# Patient Record
Sex: Female | Born: 1985 | Race: White | Hispanic: No | Marital: Married | Smoking: Never smoker
Health system: Southern US, Community
[De-identification: ages and names within clinical notes are randomized; demographics above are authoritative.]

## PROBLEM LIST (undated history)

## (undated) DIAGNOSIS — T4145XA Adverse effect of unspecified anesthetic, initial encounter: Secondary | ICD-10-CM

## (undated) DIAGNOSIS — F419 Anxiety disorder, unspecified: Secondary | ICD-10-CM

## (undated) DIAGNOSIS — D219 Benign neoplasm of connective and other soft tissue, unspecified: Secondary | ICD-10-CM

## (undated) HISTORY — DX: Anxiety disorder, unspecified: F41.9

## (undated) HISTORY — PX: OTHER SURGICAL HISTORY: SHX169

## (undated) HISTORY — DX: Benign neoplasm of connective and other soft tissue, unspecified: D21.9

---

## 2008-09-19 DIAGNOSIS — D219 Benign neoplasm of connective and other soft tissue, unspecified: Secondary | ICD-10-CM

## 2008-09-19 DIAGNOSIS — T8859XA Other complications of anesthesia, initial encounter: Secondary | ICD-10-CM

## 2008-09-19 HISTORY — DX: Benign neoplasm of connective and other soft tissue, unspecified: D21.9

## 2008-09-19 HISTORY — DX: Other complications of anesthesia, initial encounter: T88.59XA

## 2008-09-19 HISTORY — PX: MYOMECTOMY ABDOMINAL APPROACH: SUR870

## 2011-08-09 ENCOUNTER — Encounter: Payer: Self-pay | Admitting: *Deleted

## 2011-08-09 ENCOUNTER — Ambulatory Visit (INDEPENDENT_AMBULATORY_CARE_PROVIDER_SITE_OTHER): Payer: 59 | Admitting: Gynecology

## 2011-08-09 ENCOUNTER — Other Ambulatory Visit (HOSPITAL_COMMUNITY)
Admission: RE | Admit: 2011-08-09 | Discharge: 2011-08-09 | Disposition: A | Discharge status: Home / Self Care | Payer: Commercial Managed Care - PPO | Source: Ambulatory Visit | Attending: Gynecology | Admitting: Gynecology

## 2011-08-09 VITALS — BP 112/70 | Ht 62.0 in | Wt 173.0 lb

## 2011-08-09 DIAGNOSIS — B373 Candidiasis of vulva and vagina: Secondary | ICD-10-CM

## 2011-08-09 DIAGNOSIS — N898 Other specified noninflammatory disorders of vagina: Secondary | ICD-10-CM

## 2011-08-09 DIAGNOSIS — D259 Leiomyoma of uterus, unspecified: Secondary | ICD-10-CM

## 2011-08-09 DIAGNOSIS — F411 Generalized anxiety disorder: Secondary | ICD-10-CM

## 2011-08-09 DIAGNOSIS — Z01419 Encounter for gynecological examination (general) (routine) without abnormal findings: Secondary | ICD-10-CM | POA: Insufficient documentation

## 2011-08-09 DIAGNOSIS — Z13220 Encounter for screening for lipoid disorders: Secondary | ICD-10-CM

## 2011-08-09 DIAGNOSIS — R82998 Other abnormal findings in urine: Secondary | ICD-10-CM

## 2011-08-09 DIAGNOSIS — Z113 Encounter for screening for infections with a predominantly sexual mode of transmission: Secondary | ICD-10-CM

## 2011-08-09 DIAGNOSIS — Z3041 Encounter for surveillance of contraceptive pills: Secondary | ICD-10-CM

## 2011-08-09 DIAGNOSIS — F419 Anxiety disorder, unspecified: Secondary | ICD-10-CM

## 2011-08-09 DIAGNOSIS — Z131 Encounter for screening for diabetes mellitus: Secondary | ICD-10-CM

## 2011-08-09 MED ORDER — FLUCONAZOLE 150 MG PO TABS: 150.0000 mg | ORAL_TABLET | Freq: Once | ORAL | Status: AC

## 2011-08-09 MED ORDER — ESTRADIOL VALERATE-DIENOGEST 3/2-2/2-3/1 MG PO TABS: 1.0000 | ORAL_TABLET | Freq: Every day | ORAL | Status: DC

## 2011-08-09 MED ORDER — ESCITALOPRAM OXALATE 20 MG PO TABS: 20.0000 mg | ORAL_TABLET | Freq: Every day | ORAL | Status: DC

## 2011-08-09 NOTE — Progress Notes (Signed)
Sally Edwards [birth year] [id number]        25 y.o.  New patient for annual exam.  Several issues as noted below.  Past medical history,surgical history, medications, allergies, family history and social history were all reviewed and documented in the EPIC chart. ROS:  Was performed and pertinent positives and negatives are included in the history.  Exam: chaperone present Filed Vitals:   08/09/11 1007  BP: 112/70   General appearance  Normal Skin grossly normal Head/Neck normal with no cervical or supraclavicular adenopathy thyroid normal Lungs  clear Cardiac RR, without RMG Abdominal  soft, nontender, without masses, organomegaly or hernia, well healed Pfannenstiel incision Breasts  examined lying and sitting without masses, retractions, discharge or axillary adenopathy. Pelvic  Ext/BUS/vagina  normal with white discharge  Cervix  normal  GC Chlamydia screen  Uterus  axial, normal size, shape and contour, midline and mobile nontender   Adnexa  Without masses or tenderness    Anus and perineum  normal   Rectovaginal  normal sphincter tone without palpated masses or tenderness.    Assessment/Plan:  25 y.o. female for annual exam.    1. White discharge. KOH wet prep is positive for yeast. Will treat with Diflucan 150x1 dose follow up if symptoms persist or recur. 2. Birth control. Patient's on the [place] doing well wants to continue. Issues of stroke heart attack DVT reviewed I refilled her times a year. 3. Leiomyoma. Patient has history of single large myoma with open myomectomy in [place] 2010.  Surgeon had recommended C-section with deliveries. Physical exam is normal today. We talked about ultrasound screening. I do not think that that would change anything given her normal physical and will hold on that at present and she will continue with annual exams. As long as her exams are normal we will follow. If there is a question of growth then we'll ultrasound. 4. STD  screening. Patient asked for GC Chlamydia screen. She has no known exposure but wants to be screened. Offered serum screening for HIV, RPR, hepatitis B and C and she declined. 5. Anxiety. Patient is on Lexapro 20 mg daily for several years doing well I refilled her times a year. 6. Breast health. SBE monthly reviewed. 7. Pap smear. Patient does have history of abnormal Pap several years ago with follow up colposcopy and biopsy, no therapy otherwise was rendered she's had normal Pap smears since then historically. I have no records of this. I did a Pap smear today. I reviewed possible less frequent screening protocol once I review her other records and Pap smears and she is to get those records for me. 8. Health maintenance. Baseline CBC glucose lipid profile urinalysis was done. Assuming she continues well from a gynecologic standpoint she will see me in a year sooner as needed.   Dara Lords MD, 10:48 AM 08/09/2011

## 2011-08-09 NOTE — Progress Notes (Signed)
Addended by: Landis Martins R on: 08/09/2011 11:37 AM   Modules accepted: Orders

## 2011-08-10 ENCOUNTER — Other Ambulatory Visit: Payer: Self-pay | Admitting: *Deleted

## 2011-08-10 NOTE — Progress Notes (Signed)
Addended byCammie Mcgee T on: 08/10/2011 03:14 PM   Modules accepted: Orders

## 2011-08-15 ENCOUNTER — Other Ambulatory Visit: Payer: 59

## 2012-01-12 ENCOUNTER — Telehealth: Payer: Self-pay | Admitting: *Deleted

## 2012-01-12 DIAGNOSIS — Z3041 Encounter for surveillance of contraceptive pills: Secondary | ICD-10-CM

## 2012-01-12 MED ORDER — ESTRADIOL VALERATE-DIENOGEST 3/2-2/2-3/1 MG PO TABS: 1.0000 | ORAL_TABLET | Freq: Every day | ORAL | Status: DC

## 2012-01-12 NOTE — Telephone Encounter (Signed)
Pt called stating she needs 90 day supply called into pharmacy for her birth control, insurance requires this supply to fill, rx sent.

## 2012-08-29 ENCOUNTER — Encounter: Payer: Self-pay | Admitting: Gynecology

## 2012-08-29 ENCOUNTER — Other Ambulatory Visit (HOSPITAL_COMMUNITY)
Admission: RE | Admit: 2012-08-29 | Discharge: 2012-08-29 | Disposition: A | Discharge status: Home / Self Care | Payer: No Typology Code available for payment source | Source: Ambulatory Visit | Attending: Gynecology | Admitting: Gynecology

## 2012-08-29 ENCOUNTER — Ambulatory Visit (INDEPENDENT_AMBULATORY_CARE_PROVIDER_SITE_OTHER): Payer: No Typology Code available for payment source | Admitting: Gynecology

## 2012-08-29 VITALS — BP 130/74 | Ht 62.75 in | Wt 173.0 lb

## 2012-08-29 DIAGNOSIS — Z309 Encounter for contraceptive management, unspecified: Secondary | ICD-10-CM

## 2012-08-29 DIAGNOSIS — Z01419 Encounter for gynecological examination (general) (routine) without abnormal findings: Secondary | ICD-10-CM | POA: Insufficient documentation

## 2012-08-29 DIAGNOSIS — F411 Generalized anxiety disorder: Secondary | ICD-10-CM

## 2012-08-29 DIAGNOSIS — Z131 Encounter for screening for diabetes mellitus: Secondary | ICD-10-CM

## 2012-08-29 DIAGNOSIS — Z13220 Encounter for screening for lipoid disorders: Secondary | ICD-10-CM

## 2012-08-29 DIAGNOSIS — F419 Anxiety disorder, unspecified: Secondary | ICD-10-CM

## 2012-08-29 LAB — CBC WITH DIFFERENTIAL/PLATELET
Basophils Absolute: 0 10*3/uL (ref 0.0–0.1)
Eosinophils Relative: 1 % (ref 0–5)
Lymphocytes Relative: 50 % — ABNORMAL HIGH (ref 12–46)
MCV: 85.7 fL (ref 78.0–100.0)
Neutrophils Relative %: 42 % — ABNORMAL LOW (ref 43–77)
Platelets: 307 10*3/uL (ref 150–400)
RDW: 13.5 % (ref 11.5–15.5)
WBC: 5.9 10*3/uL (ref 4.0–10.5)

## 2012-08-29 LAB — LIPID PANEL
LDL Cholesterol: 89 mg/dL (ref 0–99)
Triglycerides: 97 mg/dL (ref <150)

## 2012-08-29 MED ORDER — ESCITALOPRAM OXALATE 20 MG PO TABS: 20.0000 mg | ORAL_TABLET | Freq: Every day | ORAL | Status: DC

## 2012-08-29 NOTE — Addendum Note (Signed)
Addended by: Richardson Chiquito on: 08/29/2012 02:24 PM   Modules accepted: Orders

## 2012-08-29 NOTE — Progress Notes (Signed)
Sally Edwards 1986/09/17 [id number]        26 y.o.  G0P0 for annual exam.  Doing well without complaints.  Past medical history,surgical history, medications, allergies, family history and social history were all reviewed and documented in the EPIC chart. ROS:  Was performed and pertinent positives and negatives are included in the history.  Exam: Sally Edwards assistant Filed Vitals:   08/29/12 1239  BP: 130/74  Height: 5' 2.75" (1.594 m)  Weight: 173 lb (78.472 kg)   General appearance  Normal Skin grossly normal Head/Neck normal with no cervical or supraclavicular adenopathy thyroid normal Lungs  clear Cardiac RR, without RMG Abdominal  soft, nontender, without masses, organomegaly or hernia Breasts  examined lying and sitting without masses, retractions, discharge or axillary adenopathy. Pelvic  Ext/BUS/vagina  normal   Cervix  Normal Pap  Uterus  anteverted, normal size, shape and contour, midline and mobile nontender   Adnexa  Without masses or tenderness    Anus and perineum  normal      Assessment/Plan:  26 y.o. [name] for annual exam.   1. Birth control. Patient on [place] birth control pills doing well. Wants to continue and I refilled her times a year.  2. Contemplating possible pregnancy before the years over. Recommended prenatal vitamins preconceptional for several months to start and being off the pills at least one month. Also recommended she wean from her Lexapro before attempting pregnancy. She understands we cannot deliver babies and she will need to be taking care of and obstetrical practice in town. She also understands with her myomectomy having been told that she would need a C-section that she needs to discuss this with her obstetricians also. 3. Breast health. SBE monthly. Small pigmented mole below her left breast that she is always had discussed with her I think this is an accessory small nipple with no underlying breast tissue. It is always been stable  and she'll continue to monitor. 4. Anxiety. I refilled her Lexapro she is doing well on this. 5. Pap smear.  History of abnormal Pap smear approximately 2010 with colposcopy. Does not sound like they did treatment.  Last Pap 07/2011 was normal. I repeated a Pap smear today. Assuming negative them plan 3 year follow up. 6. Health maintenance.  Baseline CBC lipid profile glucose urinalysis done. Follow up one year, sooner if she pursues pregnancy.    Dara Lords MD, 1:04 PM 08/29/2012

## 2012-08-29 NOTE — Patient Instructions (Signed)
Follow up in one year for annual exam. If you contemplate pregnancy before this been off of Lexapro before. Start on a over-the-counter prenatal vitamin several months before trying and stop the birth control pills for at least one month before trying.

## 2012-08-30 LAB — URINALYSIS W MICROSCOPIC + REFLEX CULTURE
Casts: NONE SEEN
Crystals: NONE SEEN
Ketones, ur: NEGATIVE mg/dL
Leukocytes, UA: NEGATIVE
Nitrite: NEGATIVE
Specific Gravity, Urine: 1.014 (ref 1.005–1.030)
pH: 6.5 (ref 5.0–8.0)

## 2013-03-08 ENCOUNTER — Ambulatory Visit: Payer: Self-pay | Admitting: Gynecology

## 2013-04-01 ENCOUNTER — Encounter: Payer: Self-pay | Admitting: Gynecology

## 2013-04-01 ENCOUNTER — Ambulatory Visit (INDEPENDENT_AMBULATORY_CARE_PROVIDER_SITE_OTHER): Payer: No Typology Code available for payment source | Admitting: Gynecology

## 2013-04-01 DIAGNOSIS — N912 Amenorrhea, unspecified: Secondary | ICD-10-CM

## 2013-04-01 LAB — PREGNANCY, URINE: Preg Test, Ur: POSITIVE

## 2013-04-01 NOTE — Progress Notes (Signed)
Patient presents with LMP 02/22/2013. Positive home pregnancy test. No bleeding cramping or other symptoms. Had been on Lexapro but was weaning when she became pregnant she stopped as soon as she found out. She is on a prenatal vitamin. History of abdominal myomectomy in the past with recommendation by that surgeon to have a primary cesarean section with any pregnancy.  Exam was Sally Edwards Abdomen soft nontender without masses guarding rebound organomegaly. Pelvic external BUS vagina normal. Cervix normal long and closed. Uterus soft midline mobile mildly enlarged consistent with 5 weeks. Adnexa without masses or tenderness.  Assessment and plan: Early IUP. UPT positive here. Check ultrasound as baseline for viability and dates. Also assess for any leiomyoma. She understands that we do not do obstetrics and she will make an appointment for continued obstetrical care with a group in [place].

## 2013-04-01 NOTE — Patient Instructions (Signed)
Follow up for ultrasound as scheduled 

## 2013-04-08 ENCOUNTER — Encounter: Payer: Self-pay | Admitting: Gynecology

## 2013-04-08 ENCOUNTER — Ambulatory Visit (INDEPENDENT_AMBULATORY_CARE_PROVIDER_SITE_OTHER): Payer: No Typology Code available for payment source

## 2013-04-08 ENCOUNTER — Ambulatory Visit (INDEPENDENT_AMBULATORY_CARE_PROVIDER_SITE_OTHER): Payer: No Typology Code available for payment source | Admitting: Gynecology

## 2013-04-08 DIAGNOSIS — O200 Threatened abortion: Secondary | ICD-10-CM

## 2013-04-08 DIAGNOSIS — D251 Intramural leiomyoma of uterus: Secondary | ICD-10-CM

## 2013-04-08 DIAGNOSIS — O9989 Other specified diseases and conditions complicating pregnancy, childbirth and the puerperium: Secondary | ICD-10-CM

## 2013-04-08 DIAGNOSIS — N912 Amenorrhea, unspecified: Secondary | ICD-10-CM

## 2013-04-08 DIAGNOSIS — N831 Corpus luteum cyst of ovary, unspecified side: Secondary | ICD-10-CM

## 2013-04-08 LAB — US OB TRANSVAGINAL

## 2013-04-08 NOTE — Progress Notes (Signed)
Patient presents for ultrasound for viability.  Ultrasound shows single gestational sac with normal appearing yolk sac 2.5 mm. Gestational sac approximately 6 weeks 0 days. No fetal pole. Small left intramural leiomyoma 8 mm. Cervix long and closed.  Assessment and plan: IUP approximately 6 weeks small normal appearing yolk sac. No embryonic pole. Recommend re\re ultrasound in one week for growth and viability. Patient is having a lot of morning sickness. Discussed possibilities to include Diclegis, will hold at present. Followup for ultrasound and we'll go from there.

## 2013-04-08 NOTE — Patient Instructions (Signed)
Followup for repeat ultrasound in one week

## 2013-04-12 ENCOUNTER — Telehealth: Payer: Self-pay | Admitting: *Deleted

## 2013-04-12 MED ORDER — ONDANSETRON HCL 8 MG PO TABS: 8.0000 mg | ORAL_TABLET | Freq: Three times a day (TID) | ORAL | Status: DC | PRN

## 2013-04-12 NOTE — Telephone Encounter (Signed)
Pt informed with all the below and rx has been sent.

## 2013-04-12 NOTE — Telephone Encounter (Signed)
Please call in prescription for Zofran 8 mg tid prn #20. Keep up with fluids. Multiple small feedings throughout the day. Avoid laying down immediately after eating. Gatorade is good source of electrolytes.

## 2013-04-12 NOTE — Telephone Encounter (Signed)
(  TF patient) Pt was seen on 04/08/13 approximately [redacted] weeks pregnant c/o having a lot of morning sickness and would like something for this. Please advise

## 2013-04-15 ENCOUNTER — Encounter: Payer: Self-pay | Admitting: Gynecology

## 2013-04-15 ENCOUNTER — Ambulatory Visit (INDEPENDENT_AMBULATORY_CARE_PROVIDER_SITE_OTHER): Payer: No Typology Code available for payment source

## 2013-04-15 ENCOUNTER — Ambulatory Visit (INDEPENDENT_AMBULATORY_CARE_PROVIDER_SITE_OTHER): Payer: No Typology Code available for payment source | Admitting: Gynecology

## 2013-04-15 ENCOUNTER — Other Ambulatory Visit: Payer: Self-pay | Admitting: Gynecology

## 2013-04-15 DIAGNOSIS — O3411 Maternal care for benign tumor of corpus uteri, first trimester: Secondary | ICD-10-CM

## 2013-04-15 DIAGNOSIS — N912 Amenorrhea, unspecified: Secondary | ICD-10-CM

## 2013-04-15 DIAGNOSIS — N831 Corpus luteum cyst of ovary, unspecified side: Secondary | ICD-10-CM

## 2013-04-15 DIAGNOSIS — O200 Threatened abortion: Secondary | ICD-10-CM

## 2013-04-15 DIAGNOSIS — O3410 Maternal care for benign tumor of corpus uteri, unspecified trimester: Secondary | ICD-10-CM

## 2013-04-15 DIAGNOSIS — O219 Vomiting of pregnancy, unspecified: Secondary | ICD-10-CM

## 2013-04-15 LAB — US OB TRANSVAGINAL

## 2013-04-15 MED ORDER — DOXYLAMINE-PYRIDOXINE 10-10 MG PO TBEC: 2.0000 | DELAYED_RELEASE_TABLET | Freq: Every day | ORAL | Status: DC

## 2013-04-15 NOTE — Progress Notes (Signed)
Patient presents for followup ultrasound which shows viable single IUP at 6 weeks 3 days with [facility] 12/06/2013.  Patient having a lot of nausea and was prescribed Zofran for Dr. Lily Peer. I reviewed diclegis with her insurance to go ahead and try this. #60, 2 by mouth each bedtime no refill.  Patient is to make an appointment within the next several weeks for a new OB appointment with an obstetrician in [place].

## 2013-04-15 NOTE — Patient Instructions (Signed)
Start on diglegis as we discussed. Make an appointment for new OB care.

## 2013-04-26 ENCOUNTER — Other Ambulatory Visit: Payer: Self-pay | Admitting: Gynecology

## 2013-09-02 LAB — OB RESULTS CONSOLE ANTIBODY SCREEN: Antibody Screen: NEGATIVE

## 2013-09-02 LAB — OB RESULTS CONSOLE HIV ANTIBODY (ROUTINE TESTING): HIV: NONREACTIVE

## 2013-09-02 LAB — OB RESULTS CONSOLE RPR: RPR: NONREACTIVE

## 2013-09-02 LAB — OB RESULTS CONSOLE ABO/RH: RH Type: POSITIVE

## 2013-09-02 LAB — OB RESULTS CONSOLE HEPATITIS B SURFACE ANTIGEN: Hepatitis B Surface Ag: NEGATIVE

## 2013-09-02 LAB — OB RESULTS CONSOLE RUBELLA ANTIBODY, IGM: Rubella: IMMUNE

## 2013-10-16 ENCOUNTER — Other Ambulatory Visit: Payer: Self-pay | Admitting: Obstetrics and Gynecology

## 2013-10-28 ENCOUNTER — Encounter (HOSPITAL_COMMUNITY): Payer: Self-pay | Admitting: Pharmacist

## 2013-11-05 ENCOUNTER — Encounter (HOSPITAL_COMMUNITY): Payer: Self-pay

## 2013-11-07 ENCOUNTER — Encounter (HOSPITAL_COMMUNITY): Payer: Self-pay

## 2013-11-07 ENCOUNTER — Encounter (HOSPITAL_COMMUNITY)
Admission: RE | Admit: 2013-11-07 | Discharge: 2013-11-07 | Disposition: A | Discharge status: Home / Self Care | Payer: No Typology Code available for payment source | Source: Ambulatory Visit | Attending: Obstetrics and Gynecology | Admitting: Obstetrics and Gynecology

## 2013-11-07 DIAGNOSIS — Z01812 Encounter for preprocedural laboratory examination: Secondary | ICD-10-CM | POA: Insufficient documentation

## 2013-11-07 HISTORY — DX: Adverse effect of unspecified anesthetic, initial encounter: T41.45XA

## 2013-11-07 LAB — TYPE AND SCREEN
ABO/RH(D): B POS
Antibody Screen: NEGATIVE

## 2013-11-07 LAB — CBC
HCT: 37.4 % (ref 36.0–46.0)
HEMOGLOBIN: 13.2 g/dL (ref 12.0–15.0)
MCH: 29.8 pg (ref 26.0–34.0)
MCHC: 35.3 g/dL (ref 30.0–36.0)
MCV: 84.4 fL (ref 78.0–100.0)
Platelets: 175 10*3/uL (ref 150–400)
RBC: 4.43 MIL/uL (ref 3.87–5.11)
RDW: 13.1 % (ref 11.5–15.5)
WBC: 8.4 10*3/uL (ref 4.0–10.5)

## 2013-11-07 LAB — ABO/RH: ABO/RH(D): B POS

## 2013-11-07 LAB — RPR: RPR: NONREACTIVE

## 2013-11-07 NOTE — Patient Instructions (Signed)
20 Sally Edwards  11/07/2013   Your procedure is scheduled on:  11/11/13  Enter through the Main Entrance of [facility] at [place] up the phone at the desk and dial [phone].   Call this number if you have problems the morning of surgery: [phone]   Remember:   Do not eat food:After Midnight.  Do not drink clear liquids: 4 Hours before arrival.  Take these medicines the morning of surgery with A SIP OF WATER: NA   Do not wear jewelry, make-up or nail polish.  Do not wear lotions, powders, or perfumes. You may wear deodorant.  Do not shave 24 hours prior to surgery.  Do not bring valuables to the hospital.  [facility] is not   responsible for any belongings or valuables brought to the hospital.  Contacts, dentures or bridgework may not be worn into surgery.  Leave suitcase in the car. After surgery it may be brought to your room.  For patients admitted to the hospital, checkout time is 11:00 AM the day of              discharge.   Patients discharged the day of surgery will not be allowed to drive             home.  Name and phone number of your driver: NA  Special Instructions:      Please read over the following fact sheets that you were given:   Surgical Site Infection Prevention

## 2013-11-10 NOTE — H&P (Signed)
Sally Edwards, Sally Edwards             ACCOUNT NO.:  [id number]  MEDICAL RECORD NO.:  [id number]  LOCATION:  PERIO                         FACILITY:  [facility]  PHYSICIAN:  Lovenia Kim, M.D.DATE OF BIRTH:  Dec 08, 1985  DATE OF ADMISSION:  10/02/2013 DATE OF DISCHARGE:                             HISTORY & PHYSICAL   CHIEF COMPLAINT:  Primary C-section for history of myomectomy.  HISTORY OF PRESENT ILLNESS:  A 28 year old white female, G1, P0, at 38- 2/7th weeks gestation who presents for primary C-section due to a history of myomectomy with removal of a large myoma from active segment of the uterus.  She will proceed with primary C-section per recommendation at this time.  ALLERGIES:  She has no drug allergy.  MEDICATIONS:  Prenatal vitamins.  SOCIAL HISTORY:  She is a nonsmoker, nondrinker.  She denies domestic or physical violence.  This is her first pregnancy.  FAMILY HISTORY:  Deafness, heart disease, and hypertension.  PRENATAL COURSE:  Uncomplicated.  PAST SURGICAL HISTORY:  Remarkable for myomectomy in 2010.  PHYSICAL EXAMINATION:  GENERAL:  She is a well-developed, well- nourished, white female, in no acute distress. HEENT:  Normal. NECK:  Supple.  Full range of motion. LUNGS:  Clear. HEART:  Regular rate and rhythm. ABDOMEN:  Soft, gravid, nontender.  Cervix closed, 50%, vertex, -2. EXTREMITIES:  There were no cords. NEUROLOGIC:  Nonfocal. SKIN:  Intact.  IMPRESSION: 1. Thirty-seven and a half week intrauterine pregnancy. 2. History of open myomectomy with removal of large fibroid from     active segment for primary C-section.  PLAN:  Proceed with primary low segment transverse cesarean section. Risks of anesthesia, infection, bleeding, injury to surrounding organs, and possible need for repair discussed, delayed versus immediate complications to include bowel and bladder injury noted.  The patient acknowledges and wishes to proceed.  Please note, small risk  of prematurity due to primary C-section at 37 weeks discussed with the patient.  This is considered to be worthy of the risk-benefit ratio due to the heightened risk of uterine rupture.     Lovenia Kim, M.D.     RJT/MEDQ  D:  11/10/2013  T:  11/10/2013  Job:  [id number]

## 2013-11-11 ENCOUNTER — Encounter (HOSPITAL_COMMUNITY)
Admission: RE | Disposition: A | Discharge status: Home / Self Care | Payer: Self-pay | Source: Ambulatory Visit | Attending: Obstetrics and Gynecology

## 2013-11-11 ENCOUNTER — Inpatient Hospital Stay (HOSPITAL_COMMUNITY)
Admission: AD | Admit: 2013-11-11 | Payer: No Typology Code available for payment source | Source: Ambulatory Visit | Admitting: Obstetrics and Gynecology

## 2013-11-11 ENCOUNTER — Encounter (HOSPITAL_COMMUNITY): Payer: No Typology Code available for payment source | Admitting: Anesthesiology

## 2013-11-11 ENCOUNTER — Inpatient Hospital Stay (HOSPITAL_COMMUNITY): Payer: No Typology Code available for payment source | Admitting: Anesthesiology

## 2013-11-11 ENCOUNTER — Inpatient Hospital Stay (HOSPITAL_COMMUNITY)
Admission: RE | Admit: 2013-11-11 | Discharge: 2013-11-15 | DRG: 766 | Disposition: A | Discharge status: Home / Self Care | Payer: No Typology Code available for payment source | Source: Ambulatory Visit | Attending: Obstetrics and Gynecology | Admitting: Obstetrics and Gynecology

## 2013-11-11 ENCOUNTER — Encounter (HOSPITAL_COMMUNITY): Payer: Self-pay | Admitting: *Deleted

## 2013-11-11 DIAGNOSIS — O3490 Maternal care for abnormality of pelvic organ, unspecified, unspecified trimester: Principal | ICD-10-CM | POA: Diagnosis present

## 2013-11-11 DIAGNOSIS — F411 Generalized anxiety disorder: Secondary | ICD-10-CM | POA: Diagnosis present

## 2013-11-11 DIAGNOSIS — O99344 Other mental disorders complicating childbirth: Secondary | ICD-10-CM | POA: Diagnosis present

## 2013-11-11 LAB — TYPE AND SCREEN
ABO/RH(D): B POS
Antibody Screen: NEGATIVE

## 2013-11-11 SURGERY — Surgical Case
Anesthesia: Spinal

## 2013-11-11 MED ORDER — HYDROMORPHONE HCL PF 1 MG/ML IJ SOLN: 0.2500 mg | INTRAMUSCULAR | Status: DC | PRN

## 2013-11-11 MED ORDER — GLYCOPYRROLATE 0.2 MG/ML IJ SOLN
INTRAMUSCULAR | Status: AC
  Filled 2013-11-11: qty 1

## 2013-11-11 MED ORDER — IBUPROFEN 600 MG PO TABS
600.0000 mg | ORAL_TABLET | Freq: Four times a day (QID) | ORAL | Status: DC
  Administered 2013-11-11 – 2013-11-15 (×14): 600 mg via ORAL
  Filled 2013-11-11 (×14): qty 1

## 2013-11-11 MED ORDER — METOCLOPRAMIDE HCL 5 MG/ML IJ SOLN: 10.0000 mg | Freq: Three times a day (TID) | INTRAMUSCULAR | Status: DC | PRN

## 2013-11-11 MED ORDER — MEPERIDINE HCL 25 MG/ML IJ SOLN: 6.2500 mg | INTRAMUSCULAR | Status: DC | PRN

## 2013-11-11 MED ORDER — DIPHENHYDRAMINE HCL 25 MG PO CAPS: 25.0000 mg | ORAL_CAPSULE | Freq: Four times a day (QID) | ORAL | Status: DC | PRN

## 2013-11-11 MED ORDER — LANOLIN HYDROUS EX OINT: 1.0000 "application " | TOPICAL_OINTMENT | CUTANEOUS | Status: DC | PRN

## 2013-11-11 MED ORDER — FENTANYL CITRATE 0.05 MG/ML IJ SOLN
INTRAMUSCULAR | Status: AC
  Filled 2013-11-11: qty 2

## 2013-11-11 MED ORDER — SIMETHICONE 80 MG PO CHEW
80.0000 mg | CHEWABLE_TABLET | ORAL | Status: DC
  Administered 2013-11-11 – 2013-11-15 (×4): 80 mg via ORAL
  Filled 2013-11-11 (×4): qty 1

## 2013-11-11 MED ORDER — LACTATED RINGERS IV SOLN
INTRAVENOUS | Status: DC
  Administered 2013-11-11: 12:00:00 via INTRAVENOUS

## 2013-11-11 MED ORDER — MORPHINE SULFATE (PF) 0.5 MG/ML IJ SOLN
INTRAMUSCULAR | Status: DC | PRN
  Administered 2013-11-11: .2 mg via INTRATHECAL

## 2013-11-11 MED ORDER — LACTATED RINGERS IV SOLN
Freq: Once | INTRAVENOUS | Status: AC
  Administered 2013-11-11: 12:00:00 via INTRAVENOUS

## 2013-11-11 MED ORDER — ONDANSETRON HCL 4 MG PO TABS: 4.0000 mg | ORAL_TABLET | ORAL | Status: DC | PRN

## 2013-11-11 MED ORDER — OXYTOCIN 10 UNIT/ML IJ SOLN
INTRAMUSCULAR | Status: AC
  Filled 2013-11-11: qty 4

## 2013-11-11 MED ORDER — SENNOSIDES-DOCUSATE SODIUM 8.6-50 MG PO TABS
2.0000 | ORAL_TABLET | ORAL | Status: DC
  Administered 2013-11-11 – 2013-11-15 (×4): 2 via ORAL
  Filled 2013-11-11 (×4): qty 2

## 2013-11-11 MED ORDER — PHENYLEPHRINE 8 MG IN D5W 100 ML (0.08MG/ML) PREMIX OPTIME
INJECTION | INTRAVENOUS | Status: DC | PRN
  Administered 2013-11-11: 60 ug/min via INTRAVENOUS

## 2013-11-11 MED ORDER — OXYTOCIN 40 UNITS IN LACTATED RINGERS INFUSION - SIMPLE MED: 62.5000 mL/h | INTRAVENOUS | Status: AC

## 2013-11-11 MED ORDER — DIPHENHYDRAMINE HCL 50 MG/ML IJ SOLN: 12.5000 mg | INTRAMUSCULAR | Status: DC | PRN

## 2013-11-11 MED ORDER — METHYLERGONOVINE MALEATE 0.2 MG PO TABS: 0.2000 mg | ORAL_TABLET | ORAL | Status: DC | PRN

## 2013-11-11 MED ORDER — ONDANSETRON HCL 4 MG/2ML IJ SOLN: 4.0000 mg | INTRAMUSCULAR | Status: DC | PRN

## 2013-11-11 MED ORDER — WITCH HAZEL-GLYCERIN EX PADS
1.0000 | MEDICATED_PAD | CUTANEOUS | Status: DC | PRN
Start: 2013-11-11 — End: 2013-11-15

## 2013-11-11 MED ORDER — ONDANSETRON HCL 4 MG/2ML IJ SOLN: 4.0000 mg | Freq: Three times a day (TID) | INTRAMUSCULAR | Status: DC | PRN

## 2013-11-11 MED ORDER — OXYCODONE-ACETAMINOPHEN 5-325 MG PO TABS
1.0000 | ORAL_TABLET | ORAL | Status: DC | PRN
  Administered 2013-11-11 – 2013-11-12 (×3): 1 via ORAL
  Administered 2013-11-12: 2 via ORAL
  Administered 2013-11-12 – 2013-11-15 (×18): 1 via ORAL
  Filled 2013-11-11 (×10): qty 1
  Filled 2013-11-11: qty 2
  Filled 2013-11-11: qty 1
  Filled 2013-11-11: qty 2
  Filled 2013-11-11 (×5): qty 1
  Filled 2013-11-11: qty 2
  Filled 2013-11-11 (×2): qty 1

## 2013-11-11 MED ORDER — KETOROLAC TROMETHAMINE 30 MG/ML IJ SOLN
INTRAMUSCULAR | Status: AC
  Filled 2013-11-11: qty 1

## 2013-11-11 MED ORDER — LACTATED RINGERS IV SOLN
INTRAVENOUS | Status: DC
  Administered 2013-11-11: 21:00:00 via INTRAVENOUS

## 2013-11-11 MED ORDER — DIPHENHYDRAMINE HCL 50 MG/ML IJ SOLN: 25.0000 mg | INTRAMUSCULAR | Status: DC | PRN

## 2013-11-11 MED ORDER — SIMETHICONE 80 MG PO CHEW: 80.0000 mg | CHEWABLE_TABLET | ORAL | Status: DC | PRN

## 2013-11-11 MED ORDER — DEXTROSE 5 % IV SOLN: 1.0000 ug/kg/h | INTRAVENOUS | Status: DC | PRN

## 2013-11-11 MED ORDER — ONDANSETRON HCL 4 MG/2ML IJ SOLN
INTRAMUSCULAR | Status: AC
  Filled 2013-11-11: qty 2

## 2013-11-11 MED ORDER — NALBUPHINE HCL 10 MG/ML IJ SOLN
5.0000 mg | INTRAMUSCULAR | Status: DC | PRN
  Administered 2013-11-11: 5 mg via INTRAVENOUS
  Filled 2013-11-11: qty 1

## 2013-11-11 MED ORDER — ONDANSETRON HCL 4 MG/2ML IJ SOLN
INTRAMUSCULAR | Status: DC | PRN
  Administered 2013-11-11: 4 mg via INTRAVENOUS

## 2013-11-11 MED ORDER — BUPIVACAINE IN DEXTROSE 0.75-8.25 % IT SOLN
INTRATHECAL | Status: DC | PRN
  Administered 2013-11-11: 1.8 mL via INTRATHECAL

## 2013-11-11 MED ORDER — SCOPOLAMINE 1 MG/3DAYS TD PT72
1.0000 | MEDICATED_PATCH | Freq: Once | TRANSDERMAL | Status: DC
  Administered 2013-11-11: 1.5 mg via TRANSDERMAL

## 2013-11-11 MED ORDER — NALOXONE HCL 0.4 MG/ML IJ SOLN: 0.4000 mg | INTRAMUSCULAR | Status: DC | PRN

## 2013-11-11 MED ORDER — FENTANYL CITRATE 0.05 MG/ML IJ SOLN
INTRAMUSCULAR | Status: DC | PRN
  Administered 2013-11-11: 50 ug via INTRAVENOUS
  Administered 2013-11-11: 12.5 ug via INTRATHECAL
  Administered 2013-11-11: 50 ug via INTRAVENOUS

## 2013-11-11 MED ORDER — MENTHOL 3 MG MT LOZG: 1.0000 | LOZENGE | OROMUCOSAL | Status: DC | PRN

## 2013-11-11 MED ORDER — GLYCOPYRROLATE 0.2 MG/ML IJ SOLN
INTRAMUSCULAR | Status: DC | PRN
  Administered 2013-11-11 (×2): 0.1 mg via INTRAVENOUS

## 2013-11-11 MED ORDER — BUPIVACAINE HCL (PF) 0.25 % IJ SOLN
INTRAMUSCULAR | Status: AC
  Filled 2013-11-11: qty 30

## 2013-11-11 MED ORDER — SCOPOLAMINE 1 MG/3DAYS TD PT72
MEDICATED_PATCH | TRANSDERMAL | Status: AC
Start: 2013-11-11 — End: 2013-11-14
  Administered 2013-11-11: 1.5 mg via TRANSDERMAL
  Filled 2013-11-11: qty 1

## 2013-11-11 MED ORDER — DIBUCAINE 1 % RE OINT: 1.0000 "application " | TOPICAL_OINTMENT | RECTAL | Status: DC | PRN

## 2013-11-11 MED ORDER — SODIUM CHLORIDE 0.9 % IJ SOLN
INTRAMUSCULAR | Status: AC
  Filled 2013-11-11: qty 20

## 2013-11-11 MED ORDER — SODIUM CHLORIDE 0.9 % IJ SOLN: 3.0000 mL | INTRAMUSCULAR | Status: DC | PRN

## 2013-11-11 MED ORDER — NALBUPHINE HCL 10 MG/ML IJ SOLN: 5.0000 mg | INTRAMUSCULAR | Status: DC | PRN

## 2013-11-11 MED ORDER — DIPHENHYDRAMINE HCL 25 MG PO CAPS
25.0000 mg | ORAL_CAPSULE | ORAL | Status: DC | PRN
  Administered 2013-11-12: 25 mg via ORAL
  Filled 2013-11-11 (×2): qty 1

## 2013-11-11 MED ORDER — LACTATED RINGERS IV SOLN
40.0000 [IU] | INTRAVENOUS | Status: DC | PRN
  Administered 2013-11-11: 40 [IU] via INTRAVENOUS

## 2013-11-11 MED ORDER — TETANUS-DIPHTH-ACELL PERTUSSIS 5-2.5-18.5 LF-MCG/0.5 IM SUSP: 0.5000 mL | Freq: Once | INTRAMUSCULAR | Status: DC

## 2013-11-11 MED ORDER — BUPIVACAINE HCL (PF) 0.25 % IJ SOLN
INTRAMUSCULAR | Status: DC | PRN
  Administered 2013-11-11: 10 mL

## 2013-11-11 MED ORDER — KETOROLAC TROMETHAMINE 30 MG/ML IJ SOLN
30.0000 mg | Freq: Four times a day (QID) | INTRAMUSCULAR | Status: AC | PRN
  Administered 2013-11-11: 30 mg via INTRAMUSCULAR

## 2013-11-11 MED ORDER — SIMETHICONE 80 MG PO CHEW
80.0000 mg | CHEWABLE_TABLET | Freq: Three times a day (TID) | ORAL | Status: DC
  Administered 2013-11-12 – 2013-11-15 (×11): 80 mg via ORAL
  Filled 2013-11-11 (×11): qty 1

## 2013-11-11 MED ORDER — CEFAZOLIN SODIUM-DEXTROSE 2-3 GM-% IV SOLR
2.0000 g | INTRAVENOUS | Status: AC
  Administered 2013-11-11: 2 g via INTRAVENOUS

## 2013-11-11 MED ORDER — SCOPOLAMINE 1 MG/3DAYS TD PT72
1.0000 | MEDICATED_PATCH | Freq: Once | TRANSDERMAL | Status: DC
Start: 2013-11-11 — End: 2013-11-15

## 2013-11-11 MED ORDER — ZOLPIDEM TARTRATE 5 MG PO TABS: 5.0000 mg | ORAL_TABLET | Freq: Every evening | ORAL | Status: DC | PRN

## 2013-11-11 MED ORDER — METHYLERGONOVINE MALEATE 0.2 MG/ML IJ SOLN: 0.2000 mg | INTRAMUSCULAR | Status: DC | PRN

## 2013-11-11 MED ORDER — PHENYLEPHRINE 8 MG IN D5W 100 ML (0.08MG/ML) PREMIX OPTIME
INJECTION | INTRAVENOUS | Status: AC
  Filled 2013-11-11: qty 100

## 2013-11-11 MED ORDER — MORPHINE SULFATE 0.5 MG/ML IJ SOLN
INTRAMUSCULAR | Status: AC
  Filled 2013-11-11: qty 10

## 2013-11-11 MED ORDER — KETOROLAC TROMETHAMINE 30 MG/ML IJ SOLN
30.0000 mg | Freq: Four times a day (QID) | INTRAMUSCULAR | Status: AC | PRN
Start: 2013-11-11 — End: 2013-11-12

## 2013-11-11 MED ORDER — LACTATED RINGERS IV SOLN
INTRAVENOUS | Status: DC | PRN
  Administered 2013-11-11: 13:00:00 via INTRAVENOUS

## 2013-11-11 MED ORDER — PRENATAL MULTIVITAMIN CH
1.0000 | ORAL_TABLET | Freq: Every day | ORAL | Status: DC
  Administered 2013-11-12 – 2013-11-15 (×4): 1 via ORAL
  Filled 2013-11-11 (×4): qty 1

## 2013-11-11 SURGICAL SUPPLY — 35 items
CLAMP CORD UMBIL (MISCELLANEOUS) IMPLANT
CLOTH BEACON ORANGE TIMEOUT ST (SAFETY) ×3 IMPLANT
CONTAINER PREFILL 10% NBF 15ML (MISCELLANEOUS) IMPLANT
DERMABOND ADHESIVE PROPEN (GAUZE/BANDAGES/DRESSINGS) ×2
DERMABOND ADVANCED .7 DNX6 (GAUZE/BANDAGES/DRESSINGS) ×1 IMPLANT
DRAPE LG THREE QUARTER DISP (DRAPES) IMPLANT
DRSG OPSITE POSTOP 4X10 (GAUZE/BANDAGES/DRESSINGS) ×3 IMPLANT
DURAPREP 26ML APPLICATOR (WOUND CARE) ×3 IMPLANT
ELECT REM PT RETURN 9FT ADLT (ELECTROSURGICAL) ×3
ELECTRODE REM PT RTRN 9FT ADLT (ELECTROSURGICAL) ×1 IMPLANT
EXTRACTOR VACUUM M CUP 4 TUBE (SUCTIONS) IMPLANT
EXTRACTOR VACUUM M CUP 4' TUBE (SUCTIONS)
GLOVE BIO SURGEON STRL SZ7.5 (GLOVE) ×3 IMPLANT
GOWN PREVENTION PLUS XLARGE (GOWN DISPOSABLE) ×3 IMPLANT
GOWN STRL REIN XL XLG (GOWN DISPOSABLE) ×3 IMPLANT
KIT ABG SYR 3ML LUER SLIP (SYRINGE) IMPLANT
NEEDLE HYPO 25X1 1.5 SAFETY (NEEDLE) ×3 IMPLANT
NEEDLE HYPO 25X5/8 SAFETYGLIDE (NEEDLE) IMPLANT
NS IRRIG 1000ML POUR BTL (IV SOLUTION) ×3 IMPLANT
PACK C SECTION WH (CUSTOM PROCEDURE TRAY) ×3 IMPLANT
PAD ABD 7.5X8 STRL (GAUZE/BANDAGES/DRESSINGS) ×3 IMPLANT
STAPLER VISISTAT 35W (STAPLE) IMPLANT
SUT MNCRL 0 VIOLET CTX 36 (SUTURE) ×2 IMPLANT
SUT MNCRL AB 3-0 PS2 27 (SUTURE) ×3 IMPLANT
SUT MON AB 2-0 CT1 27 (SUTURE) ×3 IMPLANT
SUT MON AB-0 CT1 36 (SUTURE) ×6 IMPLANT
SUT MONOCRYL 0 CTX 36 (SUTURE) ×4
SUT PLAIN 0 NONE (SUTURE) IMPLANT
SUT PLAIN 2 0 (SUTURE)
SUT PLAIN 2 0 XLH (SUTURE) IMPLANT
SUT PLAIN ABS 2-0 CT1 27XMFL (SUTURE) IMPLANT
SYR CONTROL 10ML LL (SYRINGE) ×3 IMPLANT
TOWEL OR 17X24 6PK STRL BLUE (TOWEL DISPOSABLE) ×3 IMPLANT
TRAY FOLEY CATH 14FR (SET/KITS/TRAYS/PACK) ×3 IMPLANT
WATER STERILE IRR 1000ML POUR (IV SOLUTION) ×3 IMPLANT

## 2013-11-11 NOTE — Anesthesia Preprocedure Evaluation (Signed)

## 2013-11-11 NOTE — Transfer of Care (Signed)
Immediate Anesthesia Transfer of Care Note  Patient: Sally Edwards  Procedure(s) Performed: Procedure(s) with comments: Primary CESAREAN SECTION (N/A) - EDD: 11/29/13  Patient Location: PACU  Anesthesia Type:Spinal  Level of Consciousness: awake, alert , oriented and patient cooperative  Airway & Oxygen Therapy: Patient Spontanous Breathing  Post-op Assessment: Report given to PACU RN and Post -op Vital signs reviewed and stable  Post vital signs: stable  Complications: No apparent anesthesia complications

## 2013-11-11 NOTE — Anesthesia Procedure Notes (Signed)
Spinal  Patient location during procedure: OB Start time: 11/11/2013 1:13 PM End time: 11/11/2013 1:24 PM Staffing Anesthesiologist: Evolett Somarriba, CHRIS Preanesthetic Checklist Completed: patient identified, surgical consent, pre-op evaluation, timeout performed, IV checked, risks and benefits discussed and monitors and equipment checked Spinal Block Patient position: sitting Prep: site prepped and draped and DuraPrep Patient monitoring: heart rate, cardiac monitor, continuous pulse ox and blood pressure Approach: midline Location: L3-4 Injection technique: single-shot Needle Needle type: Pencan  Needle gauge: 24 G Needle length: 10 cm Assessment Sensory level: T4

## 2013-11-11 NOTE — Anesthesia Postprocedure Evaluation (Signed)
  Anesthesia Post-op Note  Patient: Sally Edwards  Procedure(s) Performed: Procedure(s) with comments: Primary CESAREAN SECTION (N/A) - EDD: 11/29/13  Patient Location: PACU  Anesthesia Type:Spinal  Level of Consciousness: awake, alert  and oriented  Airway and Oxygen Therapy: Patient Spontanous Breathing  Post-op Pain: mild  Post-op Assessment: Post-op Vital signs reviewed, Patient's Cardiovascular Status Stable, Respiratory Function Stable, Patent Airway, No signs of Nausea or vomiting and Pain level controlled  Post-op Vital Signs: Reviewed and stable  Complications: No apparent anesthesia complications

## 2013-11-11 NOTE — Progress Notes (Signed)
Patient ID: Sally Edwards, female   DOB: 1986/03/17, 28 y.o.   MRN: [id number] Patient seen and examined. Consent witnessed and signed. No changes noted. Update completed. CBC    Component Value Date/Time   WBC 8.4 11/07/2013 1205   RBC 4.43 11/07/2013 1205   HGB 13.2 11/07/2013 1205   HCT 37.4 11/07/2013 1205   PLT 175 11/07/2013 1205   MCV 84.4 11/07/2013 1205   MCH 29.8 11/07/2013 1205   MCHC 35.3 11/07/2013 1205   RDW 13.1 11/07/2013 1205   LYMPHSABS 3.0 08/29/2012 1307   MONOABS 0.3 08/29/2012 1307   EOSABS 0.1 08/29/2012 1307   BASOSABS 0.0 08/29/2012 1307

## 2013-11-11 NOTE — Op Note (Signed)
Cesarean Section Procedure Note #[id number] Dictated note  Indications: previous myomectomy  Pre-operative Diagnosis: 37 week 3 day pregnancy.  Post-operative Diagnosis: same Pelvic adhesions  Surgeon: Lovenia Kim   Assistants: Drake Leach, CNM  Anesthesia: Local anesthesia 0.25.% bupivacaine and Spinal anesthesia  ASA Class: 2  Procedure Details  The patient was seen in the Holding Room. The risks, benefits, complications, treatment options, and expected outcomes were discussed with the patient.  The patient concurred with the proposed plan, giving informed consent. The risks of anesthesia, infection, bleeding and possible injury to other organs discussed. Injury to bowel, bladder, or ureter with possible need for repair discussed. Possible need for transfusion with secondary risks of hepatitis or HIV acquisition discussed. Post operative complications to include but not limited to DVT, PE and Pneumonia noted. The site of surgery properly noted/marked. The patient was taken to Operating Room # 9, identified as Sally Edwards and the procedure verified as C-Section Delivery. A Time Out was held and the above information confirmed.  After induction of anesthesia, the patient was draped and prepped in the usual sterile manner. A Pfannenstiel incision was made and carried down through the subcutaneous tissue to the fascia. Fascial incision was made and extended transversely using Mayo scissors. The fascia was separated from the underlying rectus tissue superiorly and inferiorly. The peritoneum was identified and entered. Peritoneal incision was extended longitudinally. The utero-vesical peritoneal reflection was incised transversely and the bladder flap was bluntly freed from the lower uterine segment. A low transverse uterine incision(Kerr hysterotomy) was made. Delivered from vertex presentation was a  female with Apgar scores of 8 at one minute and 9 at five minutes. Bulb suctioning gently  performed. Neonatal team in attendance.After the umbilical cord was clamped and cut cord blood was obtained for evaluation. The placenta was removed intact and appeared normal. The uterus was curetted with a dry lap pack. Good hemostasis was noted.The uterine outline, tubes and ovaries appeared normal. The uterine incision was closed with running locked sutures of 0 Monocryl x 2 layers. Hemostasis was observed. Lavage was carried out until clear.The parietal peritoneum was closed with a running 2-0 Monocryl suture. The fascia was then reapproximated with running sutures of 0 Monocryl. The skin was reapproximated with staples.  Instrument, sponge, and needle counts were correct prior the abdominal closure and at the conclusion of the case.   Findings: Dictated note  Estimated Blood Loss:  500         Drains: foley                 Specimens: placenta                 Complications:  None; patient tolerated the procedure well.         Disposition: PACU - hemodynamically stable.         Condition: stable  Attending Attestation: I performed the procedure.

## 2013-11-12 ENCOUNTER — Encounter (HOSPITAL_COMMUNITY): Payer: Self-pay | Admitting: Obstetrics and Gynecology

## 2013-11-12 LAB — CBC
HEMATOCRIT: 31.9 % — AB (ref 36.0–46.0)
HEMOGLOBIN: 11.1 g/dL — AB (ref 12.0–15.0)
MCH: 29.6 pg (ref 26.0–34.0)
MCHC: 34.8 g/dL (ref 30.0–36.0)
MCV: 85.1 fL (ref 78.0–100.0)
Platelets: 156 10*3/uL (ref 150–400)
RBC: 3.75 MIL/uL — ABNORMAL LOW (ref 3.87–5.11)
RDW: 13.4 % (ref 11.5–15.5)
WBC: 7.1 10*3/uL (ref 4.0–10.5)

## 2013-11-12 LAB — BIRTH TISSUE RECOVERY COLLECTION (PLACENTA DONATION)

## 2013-11-12 NOTE — Addendum Note (Signed)
Addendum created 11/12/13 1055 by Lenox Ponds, CRNA   Modules edited: Notes Section   Notes Section:  File: [id number]

## 2013-11-12 NOTE — Progress Notes (Signed)
POD # 1  Subjective: Pt reports feeling well/ Pain controlled with Motrin and Percocet Tolerating po/ Foley d/c'd and voiding without problems/ No n/v/ Flatus present Activity: ad lib Bleeding is light Newborn info:  Information for the patient's newborn:  Rita, Vialpando [[id number]]  female Circumcision: planning-in NICU/ Feeding: breastpumping  Objective:  VS:  Filed Vitals:   11/12/13 0104 11/12/13 0500 11/12/13 0915 11/12/13 1400  BP: 118/78 112/76 121/69 123/73  Pulse: 90  81 83  Temp: 98.3 F (36.8 C) 98.2 F (36.8 C)  97.6 F (36.4 C)  TempSrc: Oral Oral  Oral  Resp: 18 18 16 20   Height:      Weight:      SpO2: 98% 98% 99% 99%     I&O: Intake/Output     02/23 0701 - 02/24 0700 02/24 0701 - 02/25 0700   P.O. 840 240   I.V. (mL/kg) 3631.6 (43) 397.9 (4.7)   Total Intake(mL/kg) 4471.6 (53) 637.9 (7.6)   Urine (mL/kg/hr) 1455 200 (0.3)   Blood 800    Total Output 2255 200   Net +2216.6 +437.9           Recent Labs  11/12/13 0802  WBC 7.1  HGB 11.1*  HCT 31.9*  PLT 156    Blood type: --/--/B POS (02/23 1115) Rubella: Immune (12/15 0000)    Physical Exam:  General: alert and cooperative CV: Regular rate and rhythm Resp: CTA bilaterally Abdomen: soft, nontender, normal bowel sounds Incision: pressure dsg c/d/i Uterine Fundus: firm, below umbilicus, nontender Lochia: minimal Ext: edema 1+ hands and LE and Homans sign is negative, no sign of DVT    Assessment: POD # 1/ G1P0/ S/P C/Section d/t h/o myomectomy Doing well  Plan: Ambulate Continue routine post op orders   Signed: Julianne Handler, Delane Ginger, MSN, CNM 11/12/2013, 3:22 PM

## 2013-11-12 NOTE — Anesthesia Postprocedure Evaluation (Signed)
Anesthesia Post Note  Patient: Sally Edwards  Procedure(s) Performed: Procedure(s) (LRB): Primary CESAREAN SECTION (N/A)  Anesthesia type: Epidural  Patient location: Mother/Baby  Post pain: Pain level controlled  Post assessment: Post-op Vital signs reviewed  Last Vitals:  Filed Vitals:   11/12/13 0915  BP: 121/69  Pulse: 81  Temp:   Resp: 16    Post vital signs: Reviewed  Level of consciousness:alert  Complications: No apparent anesthesia complications

## 2013-11-12 NOTE — Op Note (Signed)
NAMEJERLENE, Sally Edwards             ACCOUNT NO.:  [id number]  MEDICAL RECORD NO.:  [id number]  LOCATION:  9306                          FACILITY:  [facility]  PHYSICIAN:  Lovenia Kim, M.D.DATE OF BIRTH:  05/07/1986  DATE OF PROCEDURE:  11/11/2013 DATE OF DISCHARGE:                              OPERATIVE REPORT   PREOPERATIVE DIAGNOSES: 1. Previous abdominal myomectomy. 2. Intrauterine pregnancy at 37 and 3/7th weeks.  POSTOPERATIVE DIAGNOSES: 1. Previous abdominal myomectomy. 2. Intrauterine pregnancy at 37 and 3/7th weeks. 3. Significant pelvic adhesions.  PROCEDURE:  Primary low-segment transverse cesarean section, lysis of pelvic adhesions.  SURGEON:  Lovenia Kim, M.D.  ASSISTANTDrake Leach, CNM.  ANESTHESIA:  Spinal.  ESTIMATED BLOOD LOSS:  500 mL.  COMPLICATIONS:  None.  DRAINS:  Foley.  COUNTS:  Correct.  The patient to recovery in good condition.  BRIEF OPERATIVE NOTE:  After being apprised of the risks of anesthesia, infection, bleeding, injury to surrounding organs, possible need for repair, consent was signed.  The patient was brought to the operating room, where she was administered a spinal anesthetic without complications.  Prepped and draped in usual sterile fashion.  Foley catheter placed after achieving adequate anesthesia, dilute Marcaine solution placed in the area of the previous transverse myomectomy scar. Incision was then made with a scalpel, carried down to fascia, which was nicked in the midline and opened transversely using Mayo scissors. Rectus muscles were dissected sharply in the midline, peritoneum entered sharply.  Bladder blade placed.  Lower uterine segment incision was then made.  Atraumatic delivery after amniotomy of clear fluid, full-term living female handed to pediatricians in attendance.  Apgars of 8 and 9. Due to transient neonatal transitioning issues and breathing issues, the baby was transported to the NICU with the  husband also goes in attendance.  The uterus was exteriorized, there were significant adhesions of the sigmoid colon to the posterior wall of the uterus, which were lysed sharply.  Good hemostasis was noted.  The uterus was then curetted using a dry lap pack and closed in 2 running imbricating layer of 0-Monocryl suture.  Good hemostasis noted.  Irrigation accomplished.  Uterus was replaced in the abdominal cavity.  At this time, the omentum, which was densely adherent to the anterior abdominal wall was lysed off of its attachments to the anterior wall without difficulty.  Good hemostasis was also noted.  A few bleeders on the omentum were dealt with and bleeding stopped and good hemostasis was achieved.  At this time, the peritoneum was unable to be closed due to her previous scarring from a previous surgery.  The fascia then closed using 0-Monocryl in a continuous running fashion, 2-0 plain was used to close the subcutaneous tissue in a continuous running fashion.  Skin was closed using a 3-0 Monocryl in a continuous running fashion.  A pressure dressing was placed.  The patient tolerated the procedure well and is transferred to recovery in good condition.     Lovenia Kim, M.D.     RJT/MEDQ  D:  11/11/2013  T:  11/12/2013  Job:  [id number]

## 2013-11-12 NOTE — Lactation Note (Signed)
This note was copied from the chart of Sally Edwards. Lactation Consultation Note Breastfeeding consultation services and Providing Breastmilk for your Baby in NICU booklet given to mom.  Mom has pumped four times and obtained a few drops of colostrum last pumping. Instructed to pump every 3 hours followed by hand expression.  Mom has a medela DEBP for home use.  Encouraged to call with concerns or assist need prn.  Patient Name: Sally Courtlynn Holloman GYFVC'B Date: 11/12/2013 Reason for consult: Initial assessment   Maternal Data    Feeding    LATCH Score/Interventions                      Lactation Tools Discussed/Used Pump Review: Setup, frequency, and cleaning;Milk Storage Initiated by:: RN Date initiated:: 11/11/13   Consult Status Consult Status: Follow-up Date: 11/13/13 Follow-up type: In-patient    Franki Monte 11/12/2013, 2:33 PM

## 2013-11-13 MED ORDER — SERTRALINE HCL 25 MG PO TABS
25.0000 mg | ORAL_TABLET | Freq: Every day | ORAL | Status: DC
  Administered 2013-11-13 – 2013-11-15 (×3): 25 mg via ORAL
  Filled 2013-11-13 (×4): qty 1

## 2013-11-13 NOTE — Lactation Note (Signed)
This note was copied from the chart of Boy Lilianah Buffin. Lactation Consultation Note     Follow up consult with  this mom of a NICU baby, now 79 hours post partum, and baby is 9 5/[redacted] weeks gestation. Mom is concerned that hse is not producing more that a few drops of colostrum. This is mom's first baby. I explained that  This is normal, and that her milk should begin transitioning in between today and tomorrow,   I reviewed hand expression with mom, and told her to add this after each pumping. Mom has a DEP at home, and I told her I would show her how to use it, if she wanted to bring it in. Mom knows to call for questions/concerns.   Patient Name: Boy Tristen Pennino AYTKZ'S Date: 11/13/2013 Reason for consult: Follow-up assessment;NICU baby;Late preterm infant   Maternal Data    Feeding Feeding Type: Formula Length of feed: 30 min  LATCH Score/Interventions                      Lactation Tools Discussed/Used Pump Review: Setup, frequency, and cleaning;Milk Storage;Other (comment) (premie setting) Initiated by:: bedside rn within 4 hours of delivery Date initiated:: 11/11/13   Consult Status Consult Status: Follow-up Date: 11/14/13 Follow-up type: In-patient    Tonna Corner 11/13/2013, 12:18 PM

## 2013-11-13 NOTE — Progress Notes (Signed)
PSYCHOSOCIAL ASSESSMENT ~ MATERNAL/CHILD Name: Sally Edwards                                                                                                      Referral Date: No referral   Reason/Source: NICU admission  I. FAMILY/HOME ENVIRONMENT A. Child's Legal Guardian _x__Parent(s) ___Grandparent ___Foster parent ___DSS_________________ Name: Sally Edwards                                    DOB: 04/29/1986         Age: 28          Address: [address], [place] [address]  Name: Sally Edwards                                      DOB: //                     Age:   Address: same  B. Other Household Members/Support Persons Name:                                         Relationship:                        DOB ___/___/___                   Name:                                         Relationship:                        DOB ___/___/___                   Name:                                         Relationship:                        DOB ___/___/___                   Name:                                         Relationship:                        DOB ___/___/___  C. Other Support: MOB states they have   a good support system.     II. PSYCHOSOCIAL DATA A. Information Source                                                                                             _x_Patient Interview  __Family Interview           __Other___________  B. Occupational hygienist __Employment: __Medicaid    [place]:                 _x_Private Insurance:                   __Self Pay  __Food Stamps   __WIC __[employer]     __Public Housing     __Section 8    __Maternity Care Coordination/Child Service Coordination/Early Intervention   ___School:                                                                         Grade:  __Other:   Loletha Grayer Cultural and Environment Information Cultural Issues Impacting Care: None stated  III. STRENGTHS _x__Supportive family/friends _x__Adequate  Resources _x__Compliance with medical plan _x__Home prepared for Child (including basic supplies) _x__Understanding of illness      ___Other:  IV. RISK FACTORS AND CURRENT PROBLEMS         ____No Problems Noted                                                                                                                                                                                                                                             Pt              Family          Substance Abuse  ___              ___          Mental Illness                                                                        _x__              ___ Hx of GAD  Family/Relationship Issues                                      ___               ___             Abuse/Neglect/Domestic Violence                                         ___         ___  Financial Resources                                        ___              ___             Transportation                                                                        ___               ___  DSS Involvement                                                                   ___              ___  Adjustment to Illness                                                               ___              ___  Knowledge/Cognitive Deficit                                                     ___              ___             Compliance with Treatment                                                 ___              ___  Basic Needs (food, housing, etc.)                                          ___              ___             Housing Concerns                                       ___              ___ Other_____________________________________________________________            V. SOCIAL WORK ASSESSMENT  CSW met with MOB in her third floor room/306 to introduce myself, offer support and complete assessment due to NICU  admission.  MOB was extremely pleasant and welcoming.  She states this is a good time to talk, as she is waiting on lactation consultant to come help her.  CSW explained CSW's role in the NICU and ongoing support services offered and asked how MOB is coping with her son's unexpected admission.  MOB states sadness, but feels she is doing better emotionally as baby's health improves.  CSW validated MOB's feelings of sadness and talked about common emotions related to this experience, as well as signs and symptoms of PPD to watch for.  MOB was very open about her hx of Anxiety and concerns about PPD.  She reports feeling an appropriate level of emotion at this time, but states she has talked with the NP from her OB office about Anxiety and the possibility of starting Zoloft.  MOB states she took Lexapro for years prior to getting pregnant and had decided she would not go back on medication, but is now wondering if she should.  CSW explained that Zoloft takes a period of time to reach a therapeutic level in the blood stream and, therefore, she may benefit from starting it now instead of waiting until she feels she needs it.  She also states her mother suffered from PPD with her and her brother.  Given her hx of GAD, her mother's hx of PPD and her baby's admission to NICU, CSW feels it would be wise to start an antidepressant at this time.  CSW explained that Zoloft is safe to take while breast feeding.  MOB thanked CSW for talking with her about this and stated that she felt better with a second opinion, and everyone in agreement.  CSW asked MOB to talk with CSW at any time during baby's hospitalization if she is feeling the need to process her emotions.  She seemed very grateful for the support.  She reports having a good support system and everything she needs for baby.  She states no other concerns, questions   or needs at this time.  CSW has no social concerns at this time.    VI. SOCIAL WORK PLAN  ___No Further  Intervention Required/No Barriers to Discharge   _x__Psychosocial Support and Ongoing Assessment of Needs   _x__Patient/Family Education:   ___Child Protective Services Report   County___________ Date___/____/____   ___Information/Referral to Community Resources_________________________   ___Other:        

## 2013-11-13 NOTE — Progress Notes (Signed)
Ur chart review completed.  

## 2013-11-13 NOTE — Progress Notes (Signed)
Sally Edwards seems to be coping well at this time with having her baby in the NICU unexpectedly.  She reported that as he continues to improve, she continues to feel better.  She has good family support and a network of friends.  We will continue to follow up with her as we see her in the NICU, but please also page as needs arise.    [employer] Pager, [phone] 3:55 PM   11/13/13 1500  Clinical Encounter Type  Visited With Patient and family together  Visit Type Spiritual support;Initial  Spiritual Encounters  Spiritual Needs Emotional

## 2013-11-13 NOTE — Progress Notes (Signed)
Patient ID: Sally Edwards, female   DOB: 19-Mar-1986, 29 y.o.   MRN: [id number] POD # 2  Subjective: Pt reports feeling well, occasionally sore / Pain controlled with ibuprofen and percocet Pt notes feeling frequently tearful and "afraid" regarding infant status and not being able to take him home at time of her discharge. Notable hx of anxiety and was on Lexapro in the past.  Pt's mother with significant hx PPD. Tolerating po/Voiding without problems/ No n/v/Flatus pos Activity: out of bed and ambulate Bleeding is light Newborn info:  Information for the patient's newborn:  Jamillia, Closson [[id number]]  female  Infant is stable in NICU, doing well Feeding: breast   Objective: VS: BP 125/75  Pulse 71  Temp(Src) 97.5 F (36.4 C) (Oral)  Resp 20  Ht 5\' 2"  (1.575 m)   Physical Exam:  General: alert, cooperative and no distress CV: Regular rate and rhythm Resp: clear Abdomen: soft, nontender, normal bowel sounds Uterine Fundus: firm, below umbilicus, nontender Incision: Covered with large occlusive dressing, C/D/I. Lochia: minimal Ext: edema +2 to +3 pedal and pretib edema and Homans sign is negative, no sign of DVT    A/P: POD # 2/ G1P1001/ S/P Primary C/Section at 37 wks d/t hx myomectomy Hx anxiety and strong fam hx (mother) PPD.  Will start zoloft today.  Accepts chaplain visit Doing well Continue routine post op orders Anticipate discharge home in the am   Signed: Darleen Crocker, MSN, [facility] 11/13/2013, 8:12 AM

## 2013-11-14 ENCOUNTER — Encounter (HOSPITAL_COMMUNITY): Payer: Self-pay | Admitting: *Deleted

## 2013-11-14 MED ORDER — SERTRALINE HCL 25 MG PO TABS: 25.0000 mg | ORAL_TABLET | Freq: Every day | ORAL | Status: DC

## 2013-11-14 MED ORDER — IBUPROFEN 600 MG PO TABS: 600.0000 mg | ORAL_TABLET | Freq: Four times a day (QID) | ORAL | Status: DC

## 2013-11-14 MED ORDER — OXYCODONE-ACETAMINOPHEN 5-325 MG PO TABS: 1.0000 | ORAL_TABLET | ORAL | Status: DC | PRN

## 2013-11-14 MED ORDER — BACITRACIN-NEOMYCIN-POLYMYXIN OINTMENT TUBE
TOPICAL_OINTMENT | Freq: Three times a day (TID) | CUTANEOUS | Status: DC
  Administered 2013-11-14 – 2013-11-15 (×3): via TOPICAL
  Filled 2013-11-14: qty 15

## 2013-11-14 NOTE — Discharge Summary (Signed)
DISCHARGE SUMMARY:  Patient ID: Sally Edwards MRN: [id number] DOB/AGE: Apr 21, 1986 28 y.o.  Admit date: 11/11/2013 Admission Diagnoses: 37.[redacted] weeks gestation, scheduled primary CS   Discharge date: 11/14/13    Discharge Diagnoses: S/p primary cesarean section, postpartum mood disorder  Prenatal history: G1P1001   EDC : 11/29/2013, by Last Menstrual Period  Prenatal care at [facility]  Primary provider : Dr. Ronita Hipps Prenatal course complicated by h/o myomectomy  Prenatal Labs: ABO, Rh: B (12/15 0000)  Antibody: NEG (02/23 1115) Rubella: Immune (12/15 0000)  RPR: NON REACTIVE (02/19 1205)  HBsAg: Negative (12/15 0000)  HIV: Non-reactive (12/15 0000)  GBS:   Neg GTT: WNL  Medical / Surgical History :  Past medical history:  Past Medical History  Diagnosis Date  . Anxiety   . Fibroid 2010  . Complication of anesthesia 2010    "passed out" post epidural for myomectomy    Past surgical history:  Past Surgical History  Procedure Laterality Date  . Myomectomy abdominal approach  2010  . Cesarean section N/A 11/11/2013    Procedure: Primary CESAREAN SECTION;  Surgeon: Lovenia Kim, MD;  Location: [facility];  Service: Obstetrics;  Laterality: N/A;  EDD: 11/29/13    Family History:  Family History  Problem Relation Age of Onset  . Hypertension Mother   . Heart disease Maternal Grandmother   . Heart disease Maternal Grandfather   . Heart disease Paternal Grandmother   . Heart disease Paternal Grandfather     Social History:  reports that she has never smoked. She has never used smokeless tobacco. She reports that she drinks alcohol. She reports that she does not use illicit drugs.   Allergies: Review of patient's allergies indicates no known allergies.    Current Medications at time of admission:  Prior to Admission medications   Medication Sig Start Date End Date Taking? Authorizing Provider  Prenatal Vit-Fe Sulfate-FA (PRENATAL VITAMIN PO)  Take by mouth.   Yes Historical Provider, MD    Intrapartum Course: scheduled CS   Procedures: Cesarean section delivery of female newborn by Dr Ronita Hipps on 11/12/23 See operative report for further details APGAR (1 MIN): 8   APGAR (5 MINS): 8     Postoperative / postpartum course: complicated by postpartum depressed mood d/t baby in NICU  Physical Exam:   VSS: Temp:  [97.8 F (36.6 C)-98 F (36.7 C)] 98 F (36.7 C) (02/25 2108) Pulse Rate:  [59-77] 77 (02/25 2108) Resp:  [17-18] 18 (02/25 2108) BP: (97-125)/(59-81) 125/81 mmHg (02/25 2108) SpO2:  [98 %] 98 % (02/25 2108)  LABS:  Recent Labs  11/12/13 0802  WBC 7.1  HGB 11.1*  PLT 156    General: alert and oriented Heart: RRR Lungs: clear  Abdomen: soft and non-tender / non-distended / active BS  Extremities: 1+ edema / negative Homans  Dressing: c/d/i honeycomb dressing Incision:  approximated with suture / no erythema / no ecchymosis / no drainage  Discharge Instructions:  Discharged Condition: good Activity: pelvic rest and postoperative restrictions x 2 weeks Diet: routine Medications: PNV, Ibuprofen, Percocet and Zoloft   Medication List         ibuprofen 600 MG tablet  Commonly known as:  ADVIL,MOTRIN  Take 1 tablet (600 mg total) by mouth every 6 (six) hours.     oxyCODONE-acetaminophen 5-325 MG per tablet  Commonly known as:  PERCOCET/ROXICET  Take 1-2 tablets by mouth every 4 (four) hours as needed for severe pain (moderate - severe  pain).     PRENATAL VITAMIN PO  Take by mouth.     sertraline 25 MG tablet  Commonly known as:  ZOLOFT  Take 1 tablet (25 mg total) by mouth daily.       Wound Care: Keep dressing clean and dry, remove in 2-3 days Clean incision with warm water and dry thoroughly Call the office for redness, swelling, or drainage from incision  Postpartum Instructions: [place] discharge booklet - instructions reviewed Discharge to: Home  Follow up : [facility] in 6 weeks for routine postpartum visit and 2 weeks for mood/medication follow-up                      Signed: Julianne Handler, N MSN, CNM 11/14/2013, 11:05 AM

## 2013-11-14 NOTE — Discharge Summary (Signed)
Reviewed and agree with note and plan. V.Justise Ehmann, MD  

## 2013-11-14 NOTE — Care Management Note (Signed)
    Page 1 of 1   11/14/2013     12:57:49 PM   CARE MANAGEMENT NOTE 11/14/2013  Patient:  Sally Edwards, Sally Edwards   Account Number:  [id number]  Date Initiated:  11/14/2013  Documentation initiated by:  CRAFT,TERRI  Subjective/Objective Assessment:   28 year old female admitted 11/11/13 for delivery     Action/Plan:   D/C when medically stable   Anticipated DC Date:  11/17/2013   Anticipated DC Plan:  [place]  CM consult                Status of service:  Completed, signed off  Discharge Disposition:  HOME/SELF CARE  Per UR Regulation:  Reviewed for med. necessity/level of care/duration of stay  Comments:  11/14/13, Aida Raider RNC-MNN, BSN, [phone], CM received referral from pt's nurse inquiring whether pt's insurance would pay for her to stay another night.  Per insurance check pt can stay another night.  This information given to pt's nurse.  Pt will decide if she wants to stay another night, baby in NICU.

## 2013-11-14 NOTE — Progress Notes (Signed)
POD # 3  Subjective: Pt reports feeling well/ Pain controlled with Motrin and Percocet Mood better, baby improving in NICU Tolerating po/Voiding without problems/ No n/v/ Flatus present Activity: ad lib Bleeding is light Newborn info:  Information for the patient's newborn:  Richard, Holz [[id number]]  female  / Circumcision: planning/ Feeding: breastpumping   Objective: VS: VS:  Filed Vitals:   11/12/13 2249 11/13/13 0743 11/13/13 1200 11/13/13 2108  BP:  125/75 97/59 125/81  Pulse: 82 71 59 77  Temp: 97.5 F (36.4 C) 97.5 F (36.4 C) 97.8 F (36.6 C) 98 F (36.7 C)  TempSrc: Oral Oral Oral Oral  Resp: 18 20 17 18   Height:      Weight:      SpO2: 98% 100% 98% 98%    I&O: Intake/Output     02/25 0701 - 02/26 0700 02/26 0701 - 02/27 0700   P.O. 240    I.V. (mL/kg)     Total Intake(mL/kg) 240 (2.8)    Urine (mL/kg/hr)     Total Output       Net +240            LABS:  Recent Labs  11/12/13 0802  WBC 7.1  HGB 11.1*  PLT 156                           Physical Exam:  General: alert and cooperative CV: Regular rate and rhythm Resp: CTA bilaterally Abdomen: soft, nontender, normal bowel sounds, small abrasion x2 on right lateral of incision (location of pressure dsg) Incision: healing well, no drainage, no erythema, no hernia, no seroma, no swelling, well approximated, honeycomb dressing c/d/i Uterine Fundus: firm, below umbilicus, nontender Lochia: minimal Ext: edema 1+ BLE and Homans sign is negative, no sign of DVT    Assessment: POD # 3/ G1P1001/ S/P C/Section d/t h/o myomectomy Postpartum mood change Doing well and stable for discharge home  Plan: Discharge home RX's: Ibuprofen 600mg  po Q 6 hrs prn pain #30 Refill x 1 Percocet 5/325 1 - 2 tabs po every 4 hrs prn pain #30 Refill x 0 Zoloft 25 mg daily Neosporin to skin TID [facility] booklet given    Signed: Julianne Handler, Delane Ginger, MSN, CNM 11/14/2013, 11:00 AM

## 2013-11-14 NOTE — Progress Notes (Signed)
Pt the patient, her insurance will cover one more day of hospital care post CS. Discharge cancelled.

## 2013-11-15 NOTE — Lactation Note (Signed)
This note was copied from the chart of Sally Edwards. Lactation Consultation Note  Patient Name: Sally Edwards XJOIT'G Date: 11/15/2013 Reason for consult: Follow-up assessment;NICU baby Mom is d/c today. Breast are filling. Engorgement care reviewed. Advised to set alarm with pumping schedule to pump consistently to prevent engorgement and protect milk supply. Mom started to get a small amount of colostrum yesterday. Mom has DEBP for home use. May call for assist with latching baby when baby able to go to the breast.   Maternal Data    Feeding    LATCH Score/Interventions                      Lactation Tools Discussed/Used Tools: Pump Breast pump type: Double-Electric Breast Pump   Consult Status Consult Status: Complete Date: 11/15/13 Follow-up type: In-patient    Katrine Coho 11/15/2013, 10:53 AM

## 2013-11-15 NOTE — Progress Notes (Signed)
Patient ID: Sally Edwards, female   DOB: [birth year], 28 y.o.   MRN: [id number] POD # 4  Subjective: Pt reports feeling better, and ok with d/c today / Pain controlled with ibuprofen and percocet Tolerating po/Voiding without problems/ No n/v/Flatus pos Activity: out of bed and ambulate Bleeding is spotting Newborn info:  Information for the patient's newborn:  Tomeko, Scoville [[id number]]  female Feeding: breast Infant stable  And improving in NICU   Objective: VS: Blood pressure 133/88, pulse 82, temperature 97.5 F (36.4 C), temperature source Oral, resp. rate 18.   LABS:  Recent Labs  11/12/13 0802  WBC 7.1  HGB 11.1*  PLT 156                             Physical Exam:  General: alert, cooperative and no distress CV: Regular rate and rhythm Resp: clear Abdomen: soft, nontender, normal bowel sounds Incision: Covered with Tegaderm and honeycomb dressing; well approximated. Uterine Fundus: firm, below umbilicus, nontender Lochia: minimal Ext: edema +1 pedal and Homans sign is negative, no sign of DVT    A/P: POD # 4/ G1P1001/ S/P Primary C/Section @ 37 wks d/t hx of myomectomy Post partum mood disorder Doing well and stable for discharge home RX's: Ibuprofen 600mg  po Q 6 hrs prn pain #30 Refill x 1 Percocet 5/325 1 - 2 tabs po every 6 hrs prn pain  #30 No refill Zoloft 25 mg po  X 1 week and then may increase to 2 tabs per day Rt pp visit in 6 weeks    Signed: Darleen Crocker, MSN, [facility] 11/15/2013, 7:26 AM

## 2013-11-15 NOTE — Progress Notes (Signed)
Pt discharged to home with mother.  Condition stable.  Pt ambulated to car with RN.  No equipment for home ordered at discharge. 

## 2014-07-21 ENCOUNTER — Encounter (HOSPITAL_COMMUNITY): Payer: Self-pay | Admitting: *Deleted

## 2014-08-07 LAB — OB RESULTS CONSOLE GC/CHLAMYDIA
Chlamydia: NEGATIVE
Gonorrhea: NEGATIVE

## 2014-12-16 ENCOUNTER — Other Ambulatory Visit: Payer: Self-pay | Admitting: Obstetrics and Gynecology

## 2015-02-05 LAB — OB RESULTS CONSOLE RPR: RPR: NONREACTIVE

## 2015-02-05 LAB — OB RESULTS CONSOLE RUBELLA ANTIBODY, IGM: Rubella: IMMUNE

## 2015-02-05 LAB — OB RESULTS CONSOLE ABO/RH: RH Type: POSITIVE

## 2015-02-05 LAB — OB RESULTS CONSOLE HIV ANTIBODY (ROUTINE TESTING): HIV: NONREACTIVE

## 2015-02-05 LAB — OB RESULTS CONSOLE HEPATITIS B SURFACE ANTIGEN: HEP B S AG: NEGATIVE

## 2015-02-05 LAB — OB RESULTS CONSOLE ANTIBODY SCREEN: ANTIBODY SCREEN: NEGATIVE

## 2015-02-05 NOTE — Patient Instructions (Addendum)
Your procedure is scheduled on:  Feb 09, 2015  Enter through the Main Entrance of [facility] at:  0900   Pick up the phone at the desk and dial [phone].  Call this number if you have problems the morning of surgery: [phone].  Remember: Do NOT eat food: after midnight on Sunday  Do NOT drink clear liquids after:  Midnight on Sunday  Take these medicines the morning of surgery with a SIP OF WATER:  None   Do NOT wear jewelry (body piercing), metal hair clips/bobby pins, or nail polish. Do NOT wear lotions, powders, or perfumes.  You may wear deoderant. Do NOT shave for 48 hours prior to surgery. Do NOT bring valuables to the hospital. Leave suitcase in car.  After surgery it may be brought to your room.  For patients admitted to the hospital, checkout time is 11:00 AM the day of discharge.

## 2015-02-06 ENCOUNTER — Encounter (HOSPITAL_COMMUNITY): Payer: Self-pay

## 2015-02-06 ENCOUNTER — Encounter (HOSPITAL_COMMUNITY)
Admission: RE | Admit: 2015-02-06 | Discharge: 2015-02-06 | Disposition: A | Discharge status: Home / Self Care | Payer: No Typology Code available for payment source | Source: Ambulatory Visit | Attending: Obstetrics and Gynecology | Admitting: Obstetrics and Gynecology

## 2015-02-06 LAB — CBC
HCT: 36.5 % (ref 36.0–46.0)
HEMOGLOBIN: 12.6 g/dL (ref 12.0–15.0)
MCH: 29.4 pg (ref 26.0–34.0)
MCHC: 34.5 g/dL (ref 30.0–36.0)
MCV: 85.1 fL (ref 78.0–100.0)
Platelets: 202 10*3/uL (ref 150–400)
RBC: 4.29 MIL/uL (ref 3.87–5.11)
RDW: 13.9 % (ref 11.5–15.5)
WBC: 7.5 10*3/uL (ref 4.0–10.5)

## 2015-02-07 LAB — RPR: RPR Ser Ql: NONREACTIVE

## 2015-02-08 ENCOUNTER — Encounter (HOSPITAL_COMMUNITY): Payer: Self-pay | Admitting: Anesthesiology

## 2015-02-08 NOTE — Anesthesia Preprocedure Evaluation (Addendum)
Anesthesia Evaluation  Patient identified by MRN, date of birth, ID band Patient awake    Reviewed: Allergy & Precautions, NPO status , Patient's Chart, lab work & pertinent test results  History of Anesthesia Complications (+) history of anesthetic complications  Airway Mallampati: II  TM Distance: >3 FB Neck ROM: Full    Dental no notable dental hx. (+) Teeth Intact   Pulmonary neg pulmonary ROS,  breath sounds clear to auscultation  Pulmonary exam normal       Cardiovascular negative cardio ROS Normal cardiovascular examRhythm:Regular Rate:Normal     Neuro/Psych Anxiety negative neurological ROS     GI/Hepatic negative GI ROS, Neg liver ROS,   Endo/Other  Obesity  Renal/GU negative Renal ROS     Musculoskeletal negative musculoskeletal ROS (+)   Abdominal   Peds  Hematology negative hematology ROS (+) Blood dyscrasia, ,   Anesthesia Other Findings Passed out with epidural for myomectomy  Reproductive/Obstetrics (+) Pregnancy Previous C/Section Hx/o Abdominal myomectomy                            Anesthesia Physical Anesthesia Plan  ASA: II  Anesthesia Plan: Spinal   Post-op Pain Management:    Induction:   Airway Management Planned: Natural Airway  Additional Equipment:   Intra-op Plan:   Post-operative Plan:   Informed Consent: I have reviewed the patients History and Physical, chart, labs and discussed the procedure including the risks, benefits and alternatives for the proposed anesthesia with the patient or authorized representative who has indicated his/her understanding and acceptance.     Plan Discussed with: CRNA, Anesthesiologist and Surgeon  Anesthesia Plan Comments:         Anesthesia Quick Evaluation

## 2015-02-08 NOTE — H&P (Signed)
Sally Edwards is a 29 y.o. female presenting for rpt csection with history of previous csection and previous multiple myomectomy for rpt csection at 37 weeks..  Maternal Medical History:  Contractions: Onset was less than 1 hour ago.   Frequency: irregular.   Perceived severity is mild.    Fetal activity: Perceived fetal activity is normal.   Last perceived fetal movement was within the past 24 hours.    Prenatal complications: no prenatal complications Prenatal Complications - Diabetes: none.    OB History    Gravida Para Term Preterm AB TAB SAB Ectopic Multiple Living   2 1 1       1      Past Medical History  Diagnosis Date  . Anxiety   . Fibroid 2010  . Complication of anesthesia 2010    "passed out" post epidural for myomectomy   Past Surgical History  Procedure Laterality Date  . Myomectomy abdominal approach  2010  . Cesarean section N/A 11/11/2013    Procedure: Primary CESAREAN SECTION;  Surgeon: Lovenia Kim, MD;  Location: [facility];  Service: Obstetrics;  Laterality: N/A;  EDD: 11/29/13  . Torticollis     Family History: family history includes Heart disease in her maternal grandfather, maternal grandmother, paternal grandfather, and paternal grandmother; Hypertension in her mother. Social History:  reports that she has never smoked. She has never used smokeless tobacco. She reports that she drinks alcohol. She reports that she does not use illicit drugs.   Prenatal Transfer Tool  Maternal Diabetes: No Genetic Screening: Normal Maternal Ultrasounds/Referrals: Normal Fetal Ultrasounds or other Referrals:  None Maternal Substance Abuse:  No Significant Maternal Medications:  None Significant Maternal Lab Results:  None Other Comments:  None  Review of Systems  Constitutional: Negative.   All other systems reviewed and are negative.     Last menstrual period 06/12/2014, unknown if currently breastfeeding. Maternal Exam:  Uterine Assessment:  Contraction strength is mild.  Contraction frequency is rare.   Abdomen: Patient reports no abdominal tenderness. Surgical scars: low transverse.   Fetal presentation: vertex  Introitus: Normal vulva. Normal vagina.  Ferning test: not done.  Nitrazine test: not done. Amniotic fluid character: not assessed.  Pelvis: questionable for delivery.   Cervix: Cervix evaluated by digital exam.     Physical Exam  Nursing note and vitals reviewed. Constitutional: She is oriented to person, place, and time. She appears well-developed and well-nourished.  HENT:  Head: Normocephalic and atraumatic.  Neck: Normal range of motion. Neck supple.  Cardiovascular: Normal rate and regular rhythm.   Respiratory: Effort normal and breath sounds normal.  GI: Soft. Bowel sounds are normal.  Genitourinary: Vagina normal and uterus normal.  Musculoskeletal: Normal range of motion.  Neurological: She is alert and oriented to person, place, and time. She has normal reflexes.  Skin: Skin is warm and dry.  Psychiatric: She has a normal mood and affect.    Prenatal labs: ABO, Rh: --/--/B POS (05/20 1550) Antibody: NEG (05/20 1550) Rubella: Immune (05/19 0000) RPR: Non Reactive (05/20 1550)  HBsAg: Negative (05/19 0000)  HIV: Non-reactive (05/19 0000)  GBS:     Assessment/Plan: 37+ weeks Previous multiple myomectomy Previous Csection Rpt csection. Consent done.   Ayaana Biondo J 02/08/2015, 8:43 PM

## 2015-02-09 ENCOUNTER — Inpatient Hospital Stay (HOSPITAL_COMMUNITY): Payer: No Typology Code available for payment source | Admitting: Anesthesiology

## 2015-02-09 ENCOUNTER — Inpatient Hospital Stay (HOSPITAL_COMMUNITY)
Admission: RE | Admit: 2015-02-09 | Discharge: 2015-02-11 | DRG: 766 | Disposition: A | Discharge status: Home / Self Care | Payer: No Typology Code available for payment source | Source: Ambulatory Visit | Attending: Obstetrics and Gynecology | Admitting: Obstetrics and Gynecology

## 2015-02-09 ENCOUNTER — Encounter (HOSPITAL_COMMUNITY)
Admission: RE | Disposition: A | Discharge status: Home / Self Care | Payer: Self-pay | Source: Ambulatory Visit | Attending: Obstetrics and Gynecology

## 2015-02-09 ENCOUNTER — Encounter (HOSPITAL_COMMUNITY): Payer: Self-pay | Admitting: *Deleted

## 2015-02-09 DIAGNOSIS — O99344 Other mental disorders complicating childbirth: Secondary | ICD-10-CM | POA: Diagnosis present

## 2015-02-09 DIAGNOSIS — O9973 Diseases of the skin and subcutaneous tissue complicating the puerperium: Secondary | ICD-10-CM | POA: Diagnosis not present

## 2015-02-09 DIAGNOSIS — L259 Unspecified contact dermatitis, unspecified cause: Secondary | ICD-10-CM | POA: Diagnosis not present

## 2015-02-09 DIAGNOSIS — Z3A37 37 weeks gestation of pregnancy: Secondary | ICD-10-CM | POA: Diagnosis present

## 2015-02-09 DIAGNOSIS — O3421 Maternal care for scar from previous cesarean delivery: Secondary | ICD-10-CM | POA: Diagnosis present

## 2015-02-09 DIAGNOSIS — N858 Other specified noninflammatory disorders of uterus: Secondary | ICD-10-CM | POA: Diagnosis present

## 2015-02-09 DIAGNOSIS — O3429 Maternal care due to uterine scar from other previous surgery: Secondary | ICD-10-CM | POA: Diagnosis present

## 2015-02-09 DIAGNOSIS — F419 Anxiety disorder, unspecified: Secondary | ICD-10-CM | POA: Diagnosis present

## 2015-02-09 LAB — PREPARE RBC (CROSSMATCH)

## 2015-02-09 SURGERY — Surgical Case
Anesthesia: Spinal | Site: Abdomen

## 2015-02-09 MED ORDER — KETOROLAC TROMETHAMINE 30 MG/ML IJ SOLN
30.0000 mg | Freq: Four times a day (QID) | INTRAMUSCULAR | Status: DC | PRN
Start: 2015-02-09 — End: 2015-02-09

## 2015-02-09 MED ORDER — ERYTHROMYCIN 5 MG/GM OP OINT
TOPICAL_OINTMENT | OPHTHALMIC | Status: AC
  Filled 2015-02-09: qty 1

## 2015-02-09 MED ORDER — NALBUPHINE HCL 10 MG/ML IJ SOLN: 5.0000 mg | INTRAMUSCULAR | Status: DC | PRN

## 2015-02-09 MED ORDER — ONDANSETRON HCL 4 MG/2ML IJ SOLN
INTRAMUSCULAR | Status: AC
  Filled 2015-02-09: qty 2

## 2015-02-09 MED ORDER — DIPHENHYDRAMINE HCL 25 MG PO CAPS: 25.0000 mg | ORAL_CAPSULE | Freq: Four times a day (QID) | ORAL | Status: DC | PRN

## 2015-02-09 MED ORDER — BUPIVACAINE LIPOSOME 1.3 % IJ SUSP
20.0000 mL | Freq: Once | INTRAMUSCULAR | Status: AC
  Administered 2015-02-09: 20 mL
  Filled 2015-02-09: qty 20

## 2015-02-09 MED ORDER — PRENATAL MULTIVITAMIN CH
1.0000 | ORAL_TABLET | Freq: Every day | ORAL | Status: DC
Start: 2015-02-09 — End: 2015-02-12
  Administered 2015-02-10 – 2015-02-11 (×2): 1 via ORAL
  Filled 2015-02-09 (×2): qty 1

## 2015-02-09 MED ORDER — LACTATED RINGERS IV SOLN
Freq: Once | INTRAVENOUS | Status: AC
  Administered 2015-02-09: 10:00:00 via INTRAVENOUS

## 2015-02-09 MED ORDER — WITCH HAZEL-GLYCERIN EX PADS: 1.0000 "application " | MEDICATED_PAD | CUTANEOUS | Status: DC | PRN

## 2015-02-09 MED ORDER — TETANUS-DIPHTH-ACELL PERTUSSIS 5-2.5-18.5 LF-MCG/0.5 IM SUSP: 0.5000 mL | Freq: Once | INTRAMUSCULAR | Status: DC

## 2015-02-09 MED ORDER — IBUPROFEN 600 MG PO TABS: 600.0000 mg | ORAL_TABLET | Freq: Four times a day (QID) | ORAL | Status: DC | PRN

## 2015-02-09 MED ORDER — PHENYLEPHRINE 8 MG IN D5W 100 ML (0.08MG/ML) PREMIX OPTIME
INJECTION | INTRAVENOUS | Status: AC
  Filled 2015-02-09: qty 100

## 2015-02-09 MED ORDER — NALBUPHINE HCL 10 MG/ML IJ SOLN: 5.0000 mg | Freq: Once | INTRAMUSCULAR | Status: AC | PRN

## 2015-02-09 MED ORDER — IBUPROFEN 600 MG PO TABS
600.0000 mg | ORAL_TABLET | Freq: Four times a day (QID) | ORAL | Status: DC
  Administered 2015-02-09 – 2015-02-11 (×9): 600 mg via ORAL
  Filled 2015-02-09 (×8): qty 1

## 2015-02-09 MED ORDER — ACETAMINOPHEN 325 MG PO TABS: 650.0000 mg | ORAL_TABLET | ORAL | Status: DC | PRN

## 2015-02-09 MED ORDER — LANOLIN HYDROUS EX OINT: 1.0000 "application " | TOPICAL_OINTMENT | CUTANEOUS | Status: DC | PRN

## 2015-02-09 MED ORDER — SIMETHICONE 80 MG PO CHEW: 80.0000 mg | CHEWABLE_TABLET | ORAL | Status: DC | PRN

## 2015-02-09 MED ORDER — SCOPOLAMINE 1 MG/3DAYS TD PT72
1.0000 | MEDICATED_PATCH | Freq: Once | TRANSDERMAL | Status: DC
  Filled 2015-02-09: qty 1

## 2015-02-09 MED ORDER — SIMETHICONE 80 MG PO CHEW
80.0000 mg | CHEWABLE_TABLET | ORAL | Status: DC
  Administered 2015-02-10 – 2015-02-11 (×2): 80 mg via ORAL
  Filled 2015-02-09 (×2): qty 1

## 2015-02-09 MED ORDER — SODIUM CHLORIDE 0.9 % IJ SOLN: 3.0000 mL | INTRAMUSCULAR | Status: DC | PRN

## 2015-02-09 MED ORDER — LACTATED RINGERS IV SOLN
INTRAVENOUS | Status: DC | PRN
  Administered 2015-02-09 (×3): via INTRAVENOUS

## 2015-02-09 MED ORDER — BUPIVACAINE HCL (PF) 0.25 % IJ SOLN
INTRAMUSCULAR | Status: AC
  Filled 2015-02-09: qty 10

## 2015-02-09 MED ORDER — SCOPOLAMINE 1 MG/3DAYS TD PT72
1.0000 | MEDICATED_PATCH | Freq: Once | TRANSDERMAL | Status: DC
  Administered 2015-02-09: 1.5 mg via TRANSDERMAL

## 2015-02-09 MED ORDER — OXYCODONE-ACETAMINOPHEN 5-325 MG PO TABS
2.0000 | ORAL_TABLET | ORAL | Status: DC | PRN
  Filled 2015-02-09: qty 2

## 2015-02-09 MED ORDER — PHENYLEPHRINE 8 MG IN D5W 100 ML (0.08MG/ML) PREMIX OPTIME
INJECTION | INTRAVENOUS | Status: DC | PRN
  Administered 2015-02-09: 60 ug/min via INTRAVENOUS

## 2015-02-09 MED ORDER — NALOXONE HCL 1 MG/ML IJ SOLN
1.0000 ug/kg/h | INTRAVENOUS | Status: DC | PRN
  Filled 2015-02-09: qty 2

## 2015-02-09 MED ORDER — ZOLPIDEM TARTRATE 5 MG PO TABS: 5.0000 mg | ORAL_TABLET | Freq: Every evening | ORAL | Status: DC | PRN

## 2015-02-09 MED ORDER — DIPHENHYDRAMINE HCL 25 MG PO CAPS: 25.0000 mg | ORAL_CAPSULE | ORAL | Status: DC | PRN

## 2015-02-09 MED ORDER — DIBUCAINE 1 % RE OINT: 1.0000 "application " | TOPICAL_OINTMENT | RECTAL | Status: DC | PRN

## 2015-02-09 MED ORDER — BUPIVACAINE HCL (PF) 0.25 % IJ SOLN
INTRAMUSCULAR | Status: DC | PRN
Start: 2015-02-09 — End: 2015-02-09
  Administered 2015-02-09: 10 mL

## 2015-02-09 MED ORDER — OXYTOCIN 10 UNIT/ML IJ SOLN
INTRAMUSCULAR | Status: AC
  Filled 2015-02-09: qty 4

## 2015-02-09 MED ORDER — OXYCODONE-ACETAMINOPHEN 5-325 MG PO TABS
1.0000 | ORAL_TABLET | ORAL | Status: DC | PRN
  Administered 2015-02-10 – 2015-02-11 (×9): 1 via ORAL
  Filled 2015-02-09 (×8): qty 1

## 2015-02-09 MED ORDER — 0.9 % SODIUM CHLORIDE (POUR BTL) OPTIME
TOPICAL | Status: DC | PRN
Start: 2015-02-09 — End: 2015-02-09
  Administered 2015-02-09: 1000 mL

## 2015-02-09 MED ORDER — CEFAZOLIN SODIUM-DEXTROSE 2-3 GM-% IV SOLR
INTRAVENOUS | Status: AC
Start: 2015-02-09 — End: 2015-02-09
  Filled 2015-02-09: qty 50

## 2015-02-09 MED ORDER — SIMETHICONE 80 MG PO CHEW
80.0000 mg | CHEWABLE_TABLET | Freq: Three times a day (TID) | ORAL | Status: DC
  Administered 2015-02-10 – 2015-02-11 (×5): 80 mg via ORAL
  Filled 2015-02-09 (×6): qty 1

## 2015-02-09 MED ORDER — DIPHENHYDRAMINE HCL 50 MG/ML IJ SOLN: 12.5000 mg | INTRAMUSCULAR | Status: DC | PRN

## 2015-02-09 MED ORDER — FENTANYL CITRATE (PF) 100 MCG/2ML IJ SOLN
INTRAMUSCULAR | Status: AC
  Filled 2015-02-09: qty 2

## 2015-02-09 MED ORDER — LACTATED RINGERS IV SOLN
INTRAVENOUS | Status: DC
  Administered 2015-02-09: 999 mL via INTRAVENOUS

## 2015-02-09 MED ORDER — NALOXONE HCL 0.4 MG/ML IJ SOLN: 0.4000 mg | INTRAMUSCULAR | Status: DC | PRN

## 2015-02-09 MED ORDER — DEXAMETHASONE SODIUM PHOSPHATE 10 MG/ML IJ SOLN
INTRAMUSCULAR | Status: AC
  Filled 2015-02-09: qty 1

## 2015-02-09 MED ORDER — NALBUPHINE HCL 10 MG/ML IJ SOLN
5.0000 mg | INTRAMUSCULAR | Status: DC | PRN
  Administered 2015-02-09: 5 mg via SUBCUTANEOUS

## 2015-02-09 MED ORDER — DEXAMETHASONE SODIUM PHOSPHATE 10 MG/ML IJ SOLN
INTRAMUSCULAR | Status: DC | PRN
  Administered 2015-02-09: 5 mg via INTRAVENOUS

## 2015-02-09 MED ORDER — METHYLERGONOVINE MALEATE 0.2 MG/ML IJ SOLN: 0.2000 mg | INTRAMUSCULAR | Status: DC | PRN

## 2015-02-09 MED ORDER — CEFAZOLIN SODIUM-DEXTROSE 2-3 GM-% IV SOLR
2.0000 g | INTRAVENOUS | Status: AC
  Administered 2015-02-09: 2 g via INTRAVENOUS

## 2015-02-09 MED ORDER — SCOPOLAMINE 1 MG/3DAYS TD PT72
MEDICATED_PATCH | TRANSDERMAL | Status: AC
  Administered 2015-02-09: 1.5 mg via TRANSDERMAL
  Filled 2015-02-09: qty 1

## 2015-02-09 MED ORDER — BUPIVACAINE IN DEXTROSE 0.75-8.25 % IT SOLN
INTRATHECAL | Status: DC | PRN
  Administered 2015-02-09: 1.4 mL via INTRATHECAL

## 2015-02-09 MED ORDER — KETOROLAC TROMETHAMINE 30 MG/ML IJ SOLN
30.0000 mg | Freq: Four times a day (QID) | INTRAMUSCULAR | Status: DC | PRN
  Administered 2015-02-09: 30 mg via INTRAMUSCULAR

## 2015-02-09 MED ORDER — ONDANSETRON HCL 4 MG/2ML IJ SOLN
4.0000 mg | Freq: Three times a day (TID) | INTRAMUSCULAR | Status: DC | PRN
Start: 2015-02-09 — End: 2015-02-12

## 2015-02-09 MED ORDER — FENTANYL CITRATE (PF) 100 MCG/2ML IJ SOLN
INTRAMUSCULAR | Status: DC | PRN
  Administered 2015-02-09: 25 ug via INTRATHECAL

## 2015-02-09 MED ORDER — SENNOSIDES-DOCUSATE SODIUM 8.6-50 MG PO TABS
2.0000 | ORAL_TABLET | ORAL | Status: DC
  Administered 2015-02-10 – 2015-02-11 (×2): 2 via ORAL
  Filled 2015-02-09 (×2): qty 2

## 2015-02-09 MED ORDER — MORPHINE SULFATE 0.5 MG/ML IJ SOLN
INTRAMUSCULAR | Status: AC
  Filled 2015-02-09: qty 10

## 2015-02-09 MED ORDER — OXYTOCIN 10 UNIT/ML IJ SOLN
40.0000 [IU] | INTRAVENOUS | Status: DC | PRN
  Administered 2015-02-09: 40 [IU] via INTRAVENOUS

## 2015-02-09 MED ORDER — OXYTOCIN 40 UNITS IN LACTATED RINGERS INFUSION - SIMPLE MED: 62.5000 mL/h | INTRAVENOUS | Status: AC

## 2015-02-09 MED ORDER — ONDANSETRON HCL 4 MG/2ML IJ SOLN
INTRAMUSCULAR | Status: DC | PRN
  Administered 2015-02-09: 4 mg via INTRAVENOUS

## 2015-02-09 MED ORDER — MEPERIDINE HCL 25 MG/ML IJ SOLN: 6.2500 mg | INTRAMUSCULAR | Status: DC | PRN

## 2015-02-09 MED ORDER — METHYLERGONOVINE MALEATE 0.2 MG PO TABS: 0.2000 mg | ORAL_TABLET | ORAL | Status: DC | PRN

## 2015-02-09 MED ORDER — MORPHINE SULFATE (PF) 0.5 MG/ML IJ SOLN
INTRAMUSCULAR | Status: DC | PRN
  Administered 2015-02-09: .15 mg via INTRATHECAL

## 2015-02-09 MED ORDER — FENTANYL CITRATE (PF) 100 MCG/2ML IJ SOLN: 25.0000 ug | INTRAMUSCULAR | Status: DC | PRN

## 2015-02-09 MED ORDER — KETOROLAC TROMETHAMINE 30 MG/ML IJ SOLN
INTRAMUSCULAR | Status: AC
  Filled 2015-02-09: qty 1

## 2015-02-09 MED ORDER — NALBUPHINE HCL 10 MG/ML IJ SOLN
INTRAMUSCULAR | Status: AC
  Administered 2015-02-09: 5 mg via SUBCUTANEOUS
  Filled 2015-02-09: qty 1

## 2015-02-09 MED ORDER — MENTHOL 3 MG MT LOZG: 1.0000 | LOZENGE | OROMUCOSAL | Status: DC | PRN

## 2015-02-09 MED ORDER — VITAMIN K1 1 MG/0.5ML IJ SOLN
INTRAMUSCULAR | Status: AC
  Filled 2015-02-09: qty 0.5

## 2015-02-09 SURGICAL SUPPLY — 32 items
CLAMP CORD UMBIL (MISCELLANEOUS) IMPLANT
CLOTH BEACON ORANGE TIMEOUT ST (SAFETY) ×2 IMPLANT
CONTAINER PREFILL 10% NBF 15ML (MISCELLANEOUS) IMPLANT
DRAPE SHEET LG 3/4 BI-LAMINATE (DRAPES) IMPLANT
DRSG OPSITE POSTOP 4X10 (GAUZE/BANDAGES/DRESSINGS) ×2 IMPLANT
DURAPREP 26ML APPLICATOR (WOUND CARE) ×2 IMPLANT
ELECT REM PT RETURN 9FT ADLT (ELECTROSURGICAL) ×2
ELECTRODE REM PT RTRN 9FT ADLT (ELECTROSURGICAL) ×1 IMPLANT
EXTRACTOR VACUUM M CUP 4 TUBE (SUCTIONS) IMPLANT
GLOVE BIO SURGEON STRL SZ7.5 (GLOVE) ×2 IMPLANT
GOWN STRL REUS W/TWL LRG LVL3 (GOWN DISPOSABLE) ×4 IMPLANT
KIT ABG SYR 3ML LUER SLIP (SYRINGE) IMPLANT
LIQUID BAND (GAUZE/BANDAGES/DRESSINGS) ×2 IMPLANT
NEEDLE HYPO 22GX1.5 SAFETY (NEEDLE) ×2 IMPLANT
NEEDLE HYPO 25X5/8 SAFETYGLIDE (NEEDLE) IMPLANT
NEEDLE SPNL 20GX3.5 QUINCKE YW (NEEDLE) IMPLANT
NS IRRIG 1000ML POUR BTL (IV SOLUTION) ×2 IMPLANT
PACK C SECTION WH (CUSTOM PROCEDURE TRAY) ×2 IMPLANT
STAPLER VISISTAT 35W (STAPLE) IMPLANT
SUT MNCRL 0 VIOLET CTX 36 (SUTURE) ×2 IMPLANT
SUT MNCRL AB 3-0 PS2 27 (SUTURE) IMPLANT
SUT MON AB 2-0 CT1 27 (SUTURE) ×2 IMPLANT
SUT MON AB-0 CT1 36 (SUTURE) ×4 IMPLANT
SUT MONOCRYL 0 CTX 36 (SUTURE) ×2
SUT PLAIN 0 NONE (SUTURE) IMPLANT
SUT PLAIN 2 0 (SUTURE)
SUT PLAIN 2 0 XLH (SUTURE) IMPLANT
SUT PLAIN ABS 2-0 CT1 27XMFL (SUTURE) IMPLANT
SYR 20CC LL (SYRINGE) IMPLANT
SYR CONTROL 10ML LL (SYRINGE) ×2 IMPLANT
TOWEL OR 17X24 6PK STRL BLUE (TOWEL DISPOSABLE) ×2 IMPLANT
TRAY FOLEY CATH SILVER 14FR (SET/KITS/TRAYS/PACK) ×2 IMPLANT

## 2015-02-09 NOTE — Anesthesia Procedure Notes (Signed)
Spinal Patient location during procedure: OR Start time: 02/09/2015 10:38 AM Staffing Anesthesiologist: Josephine Igo Performed by: anesthesiologist  Preanesthetic Checklist Completed: patient identified, site marked, surgical consent, pre-op evaluation, timeout performed, IV checked, risks and benefits discussed and monitors and equipment checked Spinal Block Patient position: sitting Prep: site prepped and draped and DuraPrep Patient monitoring: heart rate, cardiac monitor, continuous pulse ox and blood pressure Approach: midline Location: L3-4 Injection technique: single-shot Needle Needle type: Sprotte  Needle gauge: 24 G Needle length: 9 cm Assessment Sensory level: T4 Additional Notes Patient tolerated procedure well. Adequate sensory level.

## 2015-02-09 NOTE — Transfer of Care (Signed)
Immediate Anesthesia Transfer of Care Note  Patient: Sally Edwards  Procedure(s) Performed: Procedure(s) with comments: CESAREAN SECTION REPEAT  (N/A) - EDD: 02/27/15 Gearldine Bienenstock. RNFA confirmed with Dr. Ronita Hipps  Patient Location: PACU  Anesthesia Type:Spinal  Level of Consciousness: awake, alert  and oriented  Airway & Oxygen Therapy: Patient Spontanous Breathing  Post-op Assessment: Report given to RN and Post -op Vital signs reviewed and stable  Post vital signs: Reviewed and stable  Last Vitals:  Filed Vitals:   02/09/15 0908  BP: 126/85  Pulse: 100  Temp: 36.5 C  Resp: 20    Complications: No apparent anesthesia complications

## 2015-02-09 NOTE — Anesthesia Postprocedure Evaluation (Signed)
  Anesthesia Post-op Note  Patient: Sally Edwards  Procedure(s) Performed: Procedure(s) with comments: CESAREAN SECTION REPEAT  (N/A) - EDD: 02/27/15 Gearldine Bienenstock. RNFA confirmed with Dr. Ronita Hipps  Patient Location: PACU and Mother/Baby  Anesthesia Type:Spinal  Level of Consciousness: awake, alert  and oriented  Airway and Oxygen Therapy: Patient Spontanous Breathing  Post-op Pain: none  Post-op Assessment: Post-op Vital signs reviewed and Patient's Cardiovascular Status Stable  Post-op Vital Signs: Reviewed and stable  Last Vitals:  Filed Vitals:   02/09/15 1325  BP: 117/77  Pulse: 78  Temp: 36.7 C  Resp: 14    Complications: No apparent anesthesia complications

## 2015-02-09 NOTE — Lactation Note (Signed)
This note was copied from the chart of Sally Edwards. Lactation Consultation Note Initial visit at 7 hours of age. Mom reports baby has been sleepy.  MBU RN gave NS #20.  Worked on hand expression and hand pump to help evert nipples.  Baby finishing with [employer].  Reviewed instructions on application of NS.  Attempted to latch baby in football hold on right breast.  Baby is a little grunty sounding without distress.  Attempted latch with and without NS, baby only took a few sucks and then back to sleep STS with mom.  Discussed STS and encouraged until baby latches well.  [facility] LC resources given and discussed.  Encouraged to feed with early cues on demand.  Early newborn behavior discussed.  Hand expression demonstrated with few drops of colostrum visible.  Mom to call for assist as needed.    Patient Name: Sally Edwards [id number] Date: 02/09/2015 Reason for consult: Initial assessment;Difficult latch   Maternal Data Has patient been taught Hand Expression?: Yes Does the patient have breastfeeding experience prior to this delivery?: Yes  Feeding Feeding Type: Breast Fed Length of feed:  (few sucks)  LATCH Score/Interventions Latch: Repeated attempts needed to sustain latch, nipple held in mouth throughout feeding, stimulation needed to elicit sucking reflex. Intervention(s): Skin to skin;Teach feeding cues;Waking techniques Intervention(s): Breast massage;Breast compression  Audible Swallowing: None Intervention(s): Hand expression  Type of Nipple: Flat Intervention(s): Hand pump  Comfort (Breast/Nipple): Soft / non-tender     Hold (Positioning): Assistance needed to correctly position infant at breast and maintain latch. Intervention(s): Breastfeeding basics reviewed;Support Pillows;Position options;Skin to skin  LATCH Score: 5  Lactation Tools Discussed/Used Tools: Nipple Shields Nipple shield size: 20   Consult Status Consult Status: Follow-up Date:  02/10/15 Follow-up type: In-patient    Shoptaw, Justine Null 02/09/2015, 6:28 PM

## 2015-02-09 NOTE — Op Note (Signed)
Cesarean Section Procedure Note  Indications: previous myomectomy and previous uterine incision kerr x one  Pre-operative Diagnosis: 37 week 3 day pregnancy.  Post-operative Diagnosis: same  Surgeon: Lovenia Kim   Assistants: Drake Leach, CNM  Anesthesia: Local anesthesia 0.25.% bupivacaine, exparel, and Spinal anesthesia  ASA Class: 2  Procedure Details  The patient was seen in the Holding Room. The risks, benefits, complications, treatment options, and expected outcomes were discussed with the patient.  The patient concurred with the proposed plan, giving informed consent. The risks of anesthesia, infection, bleeding and possible injury to other organs discussed. Injury to bowel, bladder, or ureter with possible need for repair discussed. Possible need for transfusion with secondary risks of hepatitis or HIV acquisition discussed. Post operative complications to include but not limited to DVT, PE and Pneumonia noted. The site of surgery properly noted/marked. The patient was taken to Operating Room # 9, identified as Sally Edwards and the procedure verified as C-Section Delivery. A Time Out was held and the above information confirmed.  After induction of anesthesia, the patient was draped and prepped in the usual sterile manner. A Pfannenstiel incision was made and carried down through the subcutaneous tissue to the fascia. Fascial incision was made and extended transversely using Mayo scissors. The fascia was separated from the underlying rectus tissue superiorly and inferiorly. The peritoneum was identified and entered. Peritoneal incision was extended longitudinally. The utero-vesical peritoneal reflection was incised transversely and the bladder flap was bluntly freed from the lower uterine segment. A low transverse uterine incision(Kerr hysterotomy) was made. Delivered from OA presentation was a  female with Apgar scores of 8 at one minute and 9 at five minutes. Bulb suctioning gently  performed. Neonatal team in attendance.After the umbilical cord was clamped and cut cord blood was obtained for evaluation. The placenta was removed intact and appeared normal. The uterus was curetted with a dry lap pack. Good hemostasis was noted.The uterine outline, tubes and ovaries appeared normal. The uterine incision was closed with running locked sutures of 0 Monocryl x 2 layers. Hemostasis was observed. Lavage was carried out until clear.The parietal peritoneum was closed with a running 2-0 Monocryl suture. The fascia was then reapproximated with running sutures of 0 Monocryl. The skin was reapproximated with 3-0 monocryl after [place] closure with 2-0 plain.  Instrument, sponge, and needle counts were correct prior the abdominal closure and at the conclusion of the case.   Findings: FTLM, OA, post placenta  Estimated Blood Loss:  500         Drains: foley                 Specimens: placenta                 Complications:  None; patient tolerated the procedure well.         Disposition: PACU - hemodynamically stable.         Condition: stable  Attending Attestation: I performed the procedure.

## 2015-02-09 NOTE — Addendum Note (Signed)
Addendum  created 02/09/15 1415 by Hewitt Blade, CRNA   Modules edited: Notes Section   Notes Section:  File: [id number]

## 2015-02-09 NOTE — Anesthesia Postprocedure Evaluation (Signed)
  Anesthesia Post-op Note  Patient: Sally Edwards  Procedure(s) Performed: Procedure(s) with comments: CESAREAN SECTION REPEAT  (N/A) - EDD: 02/27/15 Gearldine Bienenstock. RNFA confirmed with Dr. Ronita Hipps  Patient Location: PACU  Anesthesia Type:Spinal  Level of Consciousness: awake, alert  and oriented  Airway and Oxygen Therapy: Patient Spontanous Breathing  Post-op Pain: none  Post-op Assessment: Post-op Vital signs reviewed, Patient's Cardiovascular Status Stable, Respiratory Function Stable, Patent Airway, No signs of Nausea or vomiting, Pain level controlled, No headache, No backache and No residual numbness  Post-op Vital Signs: Reviewed and stable  Last Vitals:  Filed Vitals:   02/09/15 1245  BP:   Pulse:   Temp: 36.8 C  Resp:     Complications: No apparent anesthesia complications

## 2015-02-09 NOTE — Progress Notes (Signed)
Patient ID: Sally Edwards, female   DOB: 15-Feb-1986, 29 y.o.   MRN: [id number] Patient seen and examined. Consent witnessed and signed. No changes noted. Update completed.

## 2015-02-10 ENCOUNTER — Encounter (HOSPITAL_COMMUNITY): Payer: Self-pay | Admitting: Obstetrics and Gynecology

## 2015-02-10 LAB — CBC
HCT: 33.9 % — ABNORMAL LOW (ref 36.0–46.0)
Hemoglobin: 11.2 g/dL — ABNORMAL LOW (ref 12.0–15.0)
MCH: 28.4 pg (ref 26.0–34.0)
MCHC: 33 g/dL (ref 30.0–36.0)
MCV: 85.8 fL (ref 78.0–100.0)
Platelets: 171 10*3/uL (ref 150–400)
RBC: 3.95 MIL/uL (ref 3.87–5.11)
RDW: 14 % (ref 11.5–15.5)
WBC: 7.6 10*3/uL (ref 4.0–10.5)

## 2015-02-10 LAB — TYPE AND SCREEN
ABO/RH(D): B POS
ANTIBODY SCREEN: NEGATIVE
Unit division: 0
Unit division: 0

## 2015-02-10 LAB — BIRTH TISSUE RECOVERY COLLECTION (PLACENTA DONATION)

## 2015-02-10 MED ORDER — SERTRALINE HCL 50 MG PO TABS
50.0000 mg | ORAL_TABLET | Freq: Every day | ORAL | Status: DC
  Administered 2015-02-10 – 2015-02-11 (×2): 50 mg via ORAL
  Filled 2015-02-10 (×3): qty 1

## 2015-02-10 NOTE — Lactation Note (Addendum)
This note was copied from the chart of Sally Edwards. Lactation Consultation Note Baby had been very spitty. Baby has had only one good BF. Circumcised today, sleep and not interested in BF.  Started rooting. Assessed baby's suck. Recessed chin and needs chin tug for latching. Hand expressed demonstrated w/only a few drops of colostrum. Breast soft, mom states has increased in size. No tenderness noted. Mom stated she feels the baby is hungry, discussed SNS to give baby nutrition and stimulate breast. Baby needed much assistance to stay on the breast. Breast being so soft, mom and I needed to compress the breast to keep baby latched. Mom has flat nipples using #20 NS. Demonstrated SNS set up w/20cal. Alimentum. Mom wanted baby to get something to eat and was fine with giving formula.  Mom has hand pump, gave shells to wear in bra to assist in everting nipples. Mom shown how to use DEBP & how to disassemble, clean, & reassemble parts.Mom knows to pump q3h for 15-20 min.Encouraged comfort during BF so colostrum flows better and mom will enjoy the feeding longer. Taking deep breaths and breast massage during BF. Mom encouraged to do skin-to-skin. Patient Name: Sally Edwards [id number] Date: 02/10/2015 Reason for consult: Follow-up assessment;Difficult latch   Maternal Data    Feeding Feeding Type: Formula Length of feed: 15 min  LATCH Score/Interventions Latch: Repeated attempts needed to sustain latch, nipple held in mouth throughout feeding, stimulation needed to elicit sucking reflex. Intervention(s): Skin to skin;Teach feeding cues;Waking techniques Intervention(s): Adjust position;Assist with latch;Breast massage;Breast compression  Audible Swallowing: None (none w/o SNS) Intervention(s): Hand expression;Skin to skin  Type of Nipple: Flat Intervention(s): Shells;Hand pump  Comfort (Breast/Nipple): Soft / non-tender     Hold (Positioning): Assistance needed to correctly  position infant at breast and maintain latch. Intervention(s): Breastfeeding basics reviewed;Support Pillows;Position options;Skin to skin  LATCH Score: 5  Lactation Tools Discussed/Used Tools: Shells;Nipple [place] [employer];Pump Nipple shield size: 20 Shell Type: Inverted Breast pump type: Manual   Consult Status Consult Status: Follow-up Date: 02/10/15 (late pm to check on SNS feeding) Follow-up type: In-patient    Theodoro Kalata 02/10/2015, 5:32 PM

## 2015-02-10 NOTE — Progress Notes (Signed)
POSTOPERATIVE DAY # 1 S/P cesarean section-repeat   S:         Reports feeling ok             Tolerating po intake / no nausea / no vomiting / + flatus / no BM             Bleeding is light             Pain controlled with motrin and percocet             Up ad lib / ambulatory/ voiding QS  Newborn breast-feeding  / Circumcision planned today  O:  VS: BP 110/76 mmHg  Pulse 66  Temp(Src) 98.2 F (36.8 C) (Oral)  Resp 20  SpO2 93%  LMP 06/12/2014  Breastfeeding? Unknown   LABS:               Recent Labs  02/10/15 0635  WBC 7.6  HGB 11.2*  PLT 171               Bloodtype: --/--/B POS (05/20 1550)  Rubella: Immune (05/19 0000)                                             I&O: Intake/Output      05/23 0701 - 05/24 0700 05/24 0701 - 05/25 0700   P.O. 1320    I.V. 2500    Total Intake 3820     Urine 2775 450   Blood 775    Total Output 3550 450   Net +270 -450                     Physical Exam:             Alert and Oriented X3  Lungs: Clear and unlabored  Heart: regular rate and rhythm / no mumurs  Abdomen: soft, non-tender, non-distended             Fundus: firm, non-tender, Ueven             Dressing intact              Incision:  no erythema / no ecchymosis / no drainage  Perineum: intact  Lochia: light  Extremities: trace edema, no calf pain or tenderness  A:        POD # 1 S/P CS             P:        Routine postoperative care              anticipate early DC tomorrow     Artelia Laroche CNM, MSN, [facility] 02/10/2015, 9:14 AM

## 2015-02-10 NOTE — Progress Notes (Signed)
CSW received consult for history of depression.  CSW completed chart review and noted that MOB presents with prior history of depression of anxiety (stemming from more than 3 years ago).  MOB was seen after first child was born due to depression/anxiety and NICU admission.  MOB started Zoloft to assist with that transition to the postpartum period.   Upon arrival to room, MOB presented in a pleasant mood and displayed a full range in affect. She reported that she is currently feeling "great" and stated that she re-started her Zoloft this morning as a precaution. She stated that she feels much better during this admission since it is a "different experience" as her first child was admitted unexpectedly to the NICU.   Full assessment was not completed since MOB reported that she feels "great", had no symptoms during the pregnancy, has strong supports, and has taken steps to prepare for her transition to the postpartum period (restarted her Zoloft).   MOB agreed to contact CSW if additional needs arise at the hospital. MOB expressed appreciation.

## 2015-02-11 MED ORDER — OXYCODONE-ACETAMINOPHEN 5-325 MG PO TABS: 1.0000 | ORAL_TABLET | Freq: Four times a day (QID) | ORAL | Status: DC | PRN

## 2015-02-11 MED ORDER — SERTRALINE HCL 50 MG PO TABS: 50.0000 mg | ORAL_TABLET | Freq: Every day | ORAL | Status: AC

## 2015-02-11 MED ORDER — HYDROCORTISONE 1 % EX OINT: 1.0000 "application " | TOPICAL_OINTMENT | Freq: Two times a day (BID) | CUTANEOUS | Status: DC

## 2015-02-11 MED ORDER — IBUPROFEN 600 MG PO TABS: 600.0000 mg | ORAL_TABLET | Freq: Four times a day (QID) | ORAL | Status: DC | PRN

## 2015-02-11 NOTE — Discharge Summary (Signed)
POSTOPERATIVE DISCHARGE SUMMARY:  Patient ID: Sally Edwards MRN: [id number] DOB/AGE: 02-28-1986 29 y.o.  Admit date: 02/09/2015 Admission Diagnoses: Scheduled Cesarean Delivery   Discharge date:   Discharge Diagnoses: S/P Repeat C/S due to on 02/09/2015        Prenatal history: G2P2002   EDC : 02/27/2015, by LMP  Has received prenatal care at [facility] since 9.[redacted] wks gestation. Primary provider : Dr. Ronita Hipps Prenatal course complicated by closely spaced pregnancies / PTL  Prenatal Labs: ABO, Rh: --/--/B POS (05/20 1550)  Antibody: NEG (05/20 1550) Rubella: Immune (05/19 0000)  RPR: Non Reactive (05/20 1550)  HBsAg: Negative (05/19 0000)  HIV: Non-reactive (05/19 0000)  GTT : Normal - 118 GBS:  Negative  Medical / Surgical History :  Past medical history:  Past Medical History  Diagnosis Date  . Anxiety   . Fibroid 2010  . Complication of anesthesia 2010    "passed out" post epidural for myomectomy    Past surgical history:  Past Surgical History  Procedure Laterality Date  . Myomectomy abdominal approach  2010  . Cesarean section N/A 11/11/2013    Procedure: Primary CESAREAN SECTION;  Surgeon: Lovenia Kim, MD;  Location: [facility];  Service: Obstetrics;  Laterality: N/A;  EDD: 11/29/13  . Torticollis    . Cesarean section N/A 02/09/2015    Procedure: CESAREAN SECTION REPEAT ;  Surgeon: Brien Few, MD;  Location: [facility];  Service: Obstetrics;  Laterality: N/A;  EDD: 02/27/15 Gearldine Bienenstock. RNFA confirmed with Dr. Ronita Hipps     Allergies: Review of patient's allergies indicates no known allergies.   Intrapartum Course:  Admitted for scheduled cesarean delivery d/t previous myomectomy and previous cesarean delivery - see operative report for details  Physical Exam:   VSS: Blood pressure 115/83, pulse 70, temperature 97.4 F (36.3 C), temperature source Oral, resp. rate 18, height 5\' 3"  (1.6 m), weight 87.091 kg (192 lb), last menstrual period  06/12/2014, SpO2 97 %, currently breastfeeding.  LABS:  Recent Labs  02/10/15 0635  WBC 7.6  HGB 11.2*  PLT 171    Newborn Data Live born female on 02/09/2015 Birth Weight: 8 lb 0.4 oz (3640 g) APGAR: 9, 9  See operative report for further details  Home with mother.  Discharge Instructions:  Wound Care: keep clean and dry / remove honeycomb POD 5 Postpartum Instructions: [facility] discharge booklet - instructions reviewed Medications:    Medication List    TAKE these medications        acetaminophen 500 MG tablet  Commonly known as:  TYLENOL  Take 1,000 mg by mouth every 6 (six) hours as needed for mild pain.     calcium carbonate 500 MG chewable tablet  Commonly known as:  TUMS - dosed in mg elemental calcium  Chew 2 tablets by mouth 4 (four) times daily as needed for indigestion or heartburn.     ibuprofen 600 MG tablet  Commonly known as:  ADVIL,MOTRIN  Take 1 tablet (600 mg total) by mouth every 6 (six) hours as needed for mild pain.     loratadine 10 MG tablet  Commonly known as:  CLARITIN  Take 10 mg by mouth daily.     METAMUCIL PO  Take 2 tablets by mouth daily.     oxyCODONE-acetaminophen 5-325 MG per tablet  Commonly known as:  PERCOCET/ROXICET  Take 1-2 tablets by mouth every 6 (six) hours as needed (for pain scale greater than 7).     PRENATAL  VITAMIN PO  Take 1 tablet by mouth daily.     sertraline 50 MG tablet  Commonly known as:  ZOLOFT  Take 1 tablet (50 mg total) by mouth daily.           Follow-up Information    Follow up with Lovenia Kim, MD. Schedule an appointment as soon as possible for a visit in 2 weeks.   Specialty:  Obstetrics and Gynecology   Why:  For postpartum depression evaluation   Contact information:   [place] [address] [phone]       Follow up with Lovenia Kim, MD. Schedule an appointment as soon as possible for a visit in 6 weeks.   Specialty:  Obstetrics and Gynecology    Why:  postpartum visit   Contact information:   [place] [id number] [phone]         Signed: Graceann Congress, MSN, CNM 02/11/2015, 9:33 AM

## 2015-02-11 NOTE — Lactation Note (Signed)
This note was copied from the chart of Sally Edwards. Lactation Consultation Note: Mom reports that baby just finished feeding- tried at the breast and then gave formula after nursing. Is using NS. Mom pumped for 4 months for first baby in NICU. Reports she has no milk yet. Encouragement given. Is pumping but not obtaining any Colostrum. Encouraged to continue trying at the breast- without NS is she can. OP appointment made for Wed 6/1 at 10:30 am- first available. Will do [employer] Sally Edwards rental . No questions at present. To call prn  Patient Name: Sally Edwards [id number] Date: 02/11/2015 Reason for consult: Follow-up assessment   Maternal Data Formula Feeding for Exclusion: No Has patient been taught Hand Expression?: Yes Does the patient have breastfeeding experience prior to this delivery?: Yes  Feeding Feeding Type: Breast Fed  LATCH Score/Interventions Latch: Repeated attempts needed to sustain latch, nipple held in mouth throughout feeding, stimulation needed to elicit sucking reflex. Intervention(s): Waking techniques Intervention(s): Assist with latch;Breast massage;Breast compression  Audible Swallowing: None Intervention(s): Skin to skin  Type of Nipple: Flat Intervention(s): Shells;Hand pump (nipple shield size 20)  Comfort (Breast/Nipple): Soft / non-tender     Hold (Positioning): No assistance needed to correctly position infant at breast.  LATCH Score: 6  Lactation Tools Discussed/Used     Consult Status Consult Status: Follow-up Date: 02/18/15    Truddie Crumble 02/11/2015, 10:50 AM

## 2015-02-11 NOTE — Lactation Note (Signed)
This note was copied from the chart of Sally Tiea Manninen. Lactation Consultation Note; [employer] rental completed. No questions at present. To call prn  Patient Name: Sally Edwards [id number] Date: 02/11/2015 Reason for consult: Follow-up assessment   Maternal Data Formula Feeding for Exclusion: No Has patient been taught Hand Expression?: Yes Does the patient have breastfeeding experience prior to this delivery?: Yes  Feeding Feeding Type: Breast Fed  LATCH Score/Interventions Latch: Repeated attempts needed to sustain latch, nipple held in mouth throughout feeding, stimulation needed to elicit sucking reflex. Intervention(s): Waking techniques Intervention(s): Assist with latch;Breast massage;Breast compression  Audible Swallowing: None Intervention(s): Skin to skin  Type of Nipple: Flat Intervention(s): Shells;Hand pump (nipple shield size 20)  Comfort (Breast/Nipple): Soft / non-tender     Hold (Positioning): No assistance needed to correctly position infant at breast.  LATCH Score: 6  Lactation Tools Discussed/Used     Consult Status Consult Status: Follow-up Date: 02/18/15    Truddie Crumble 02/11/2015, 11:45 AM

## 2015-02-11 NOTE — Discharge Instructions (Signed)
Breast Pumping Tips °If you are breastfeeding, there may be times when you cannot feed your baby directly. Returning to work or going on a trip are common examples. Pumping allows you to store breast milk and feed it to your baby later.  °You may not get much milk when you first start to pump. Your breasts should start to make more after a few days. If you pump at the times you usually feed your baby, you may be able to keep making enough milk to feed your baby without also using formula. The more often you pump, the more milk you will produce.  °WHEN SHOULD I PUMP?  °· You can begin to pump soon after delivery. However, some experts recommend waiting about 4 weeks before giving your infant a bottle to make sure breastfeeding is going well.  °· If you plan to return to work, begin pumping a few weeks before. This will help you develop techniques that work best for you. It also lets you build up a supply of breast milk.   °· When you are with your infant, feed on demand and pump after each feeding.   °· When you are away from your infant for several hours, pump for about 15 minutes every 2-3 hours. Pump both breasts at the same time if you can.   °· If your infant has a formula feeding, make sure to pump around the same time.     °· If you drink any alcohol, wait 2 hours before pumping.   °HOW DO I PREPARE TO PUMP? °Your let-down reflex is the natural reaction to stimulation that makes your breast milk flow. It is easier to stimulate this reflex when you are relaxed. Find relaxation techniques that work for you. If you have difficulty with your let-down reflex, try these methods:  °· Smell one of your infant's blankets or an item of clothing.   °· Look at a picture or video of your infant.   °· Sit in a quiet, private space.   °· Massage the breast you plan to pump.   °· Place soothing warmth on the breast.   °· Play relaxing music.   °WHAT ARE SOME GENERAL BREAST PUMPING TIPS? °· Wash your hands before you pump. You  do not need to wash your nipples or breasts. °· There are three ways to pump. °¨ You can use your hand to massage and compress your breast. °¨ You can use a handheld manual pump. °¨ You can use an electric pump.   °· Make sure the suction cup (flange) on the breast pump is the right size. Place the flange directly over the nipple. If it is the wrong size or placed the wrong way, it may be painful and cause nipple damage.   °· If pumping is uncomfortable, apply a small amount of purified or modified lanolin to your nipple and areola. °· If you are using an electric pump, adjust the speed and suction power to be more comfortable. °· If pumping is painful or if you are not getting very much milk, you may need a different type of pump. A lactation consultant can help you determine what type of pump to use.   °· Keep a full water bottle near you at all times. Drinking lots of fluid helps you make more milk.  °· You can store your milk to use later. Pumped breast milk can be stored in a sealable, sterile container or plastic bag. Label all stored breast milk with the date you pumped it. °¨ Milk can stay out at room temperature for up to 8 hours. °¨   You can store your milk in the refrigerator for up to 8 days. °¨ You can store your milk in the freezer for 3 months. Thaw frozen milk using warm water. Do not put it in the microwave. °· Do not smoke. Smoking can lower your milk supply and harm your infant. If you need help quitting, ask your health care provider to recommend a program.   °WHEN SHOULD I CALL MY HEALTH CARE PROVIDER OR A LACTATION CONSULTANT? °· You are having trouble pumping. °· You are concerned that you are not making enough milk. °· You have nipple pain, soreness, or redness. °· You want to use birth control. Birth control pills may lower your milk supply. Talk to your health care provider about your options. °Document Released: 02/23/2010 Document Revised: 09/10/2013 Document Reviewed:  06/28/2013 °[employer]® Patient Information ©2015 [employer]. This information is not intended to replace advice given to you by your health care provider. Make sure you discuss any questions you have with your health care provider. ° °Nutrition for the New Mother  °A new mother needs good health and nutrition so she can have energy to take care of a new baby. Whether a mother breastfeeds or formula feeds the baby, it is important to have a well-balanced diet. Foods from all the food groups should be chosen to meet the new mother's energy needs and to give her the nutrients needed for repair and healing.  °A HEALTHY EATING PLAN °The My Pyramid plan for Moms outlines what you should eat to help you and your baby stay healthy. The energy and amount of food you need depends on whether or not you are breastfeeding. If you are breastfeeding you will need more nutrients. If you choose not to breastfeed, your nutrition goal should be to return to a healthy weight. Limiting calories may be needed if you are not breastfeeding.  °HOME CARE INSTRUCTIONS  °· For a personal plan based on your unique needs, see your Registered Dietitian or visit www.mypyramid.gov. °· Eat a variety of foods. The plan below will help guide you. The following chart has a suggested daily meal plan from the My Pyramid for Moms. °· Eat a variety of fruits and vegetables. °· Eat more dark green and orange vegetables and cooked dried beans. °· Make half your grains whole grains. Choose whole instead of refined grains. °· Choose low-fat or lean meats and poultry. °· Choose low-fat or fat-free dairy products like milk, cheese, or yogurt. °Fruits °· Breastfeeding: 2 cups °· Non-Breastfeeding: 2 cups °· What Counts as a serving? °¨ 1 cup of fruit or juice. °¨ ½ cup dried fruit. °Vegetables °· Breastfeeding: 3 cups °· Non-Breastfeeding: 2 ½ cups °· What Counts as a serving? °¨ 1 cup raw or cooked vegetables. °¨ Juice or 2 cups raw leafy  vegetables. °Grains °· Breastfeeding: 8 oz °· Non-Breastfeeding: 6 oz °· What Counts as a serving? °¨ 1 slice bread. °¨ 1 oz ready-to-eat cereal. °¨ ½ cup cooked pasta, rice, or cereal. °Meat and Beans °· Breastfeeding: 6 ½ oz °· Non-Breastfeeding: 5 ½ oz °· What Counts as a serving? °¨ 1 oz lean meat, poultry, or fish °¨ ¼ cup cooked dry beans °¨ ½ oz nuts or 1 egg °¨ 1 tbs peanut butter °Milk °· Breastfeeding: 3 cups °· Non-Breastfeeding: 3 cups °· What Counts as a serving? °¨ 1 cup milk. °¨ 8 oz yogurt. °¨ 1 ½ oz cheese. °¨ 2 oz processed cheese. °TIPS FOR THE BREASTFEEDING MOM °· Rapid weight   loss is not suggested when you are breastfeeding. By simply breastfeeding, you will be able to lose the weight gained during your pregnancy. Your caregiver can keep track of your weight and tell you if your weight loss is appropriate.  Be sure to drink fluids. You may notice that you are thirstier than usual. A suggestion is to drink a glass of water or other beverage whenever you breastfeed.  Avoid alcohol as it can be passed into your breast milk.  Limit caffeine drinks to no more than 2 to 3 cups per day.  You may need to keep taking your prenatal vitamin while you are breastfeeding. Talk with your caregiver about taking a vitamin or supplement. RETURING TO A HEALTHY WEIGHT  The My Pyramid Plan for Moms will help you return to a healthy weight. It will also provide the nutrients you need.  You may need to limit "empty" calories. These include:  High fat foods like fried foods, fatty meats, fast food, butter, and mayonnaise.  High sugar foods like sodas, jelly, candy, and sweets.  Be physically active. Include 30 minutes of exercise or more each day. Choose an activity you like such as walking, swimming, biking, or aerobics. Check with your caregiver before you start to exercise. Document Released: 12/13/2007 Document Revised: 11/28/2011 Document Reviewed: 12/13/2007 [facility] Patient Information  2015 [place], [place]. This information is not intended to replace advice given to you by your health care provider. Make sure you discuss any questions you have with your health care provider. Postpartum Depression and Baby Blues The postpartum period begins right after the birth of a baby. During this time, there is often a great amount of joy and excitement. It is also a time of many changes in the life of the parents. Regardless of how many times a mother gives birth, each child brings new challenges and dynamics to the family. It is not unusual to have feelings of excitement along with confusing shifts in moods, emotions, and thoughts. All mothers are at risk of developing postpartum depression or the "baby blues." These mood changes can occur right after giving birth, or they may occur many months after giving birth. The baby blues or postpartum depression can be mild or severe. Additionally, postpartum depression can go away rather quickly, or it can be a long-term condition.  CAUSES Raised hormone levels and the rapid drop in those levels are thought to be a main cause of postpartum depression and the baby blues. A number of hormones change during and after pregnancy. Estrogen and progesterone usually decrease right after the delivery of your baby. The levels of thyroid hormone and various cortisol steroids also rapidly drop. Other factors that play a role in these mood changes include major life events and genetics.  RISK FACTORS If you have any of the following risks for the baby blues or postpartum depression, know what symptoms to watch out for during the postpartum period. Risk factors that may increase the likelihood of getting the baby blues or postpartum depression include:  Having a personal or family history of depression.   Having depression while being pregnant.   Having premenstrual mood issues or mood issues related to oral contraceptives.  Having a lot of life stress.   Having  marital conflict.   Lacking a social support network.   Having a baby with special needs.   Having health problems, such as diabetes.  SIGNS AND SYMPTOMS Symptoms of baby blues include:  Brief changes in mood, such as going  from extreme happiness to sadness.  Decreased concentration.   Difficulty sleeping.   Crying spells, tearfulness.   Irritability.   Anxiety.  Symptoms of postpartum depression typically begin within the first month after giving birth. These symptoms include:  Difficulty sleeping or excessive sleepiness.   Marked weight loss.   Agitation.   Feelings of worthlessness.   Lack of interest in activity or food.  Postpartum psychosis is a very serious condition and can be dangerous. Fortunately, it is rare. Displaying any of the following symptoms is cause for immediate medical attention. Symptoms of postpartum psychosis include:   Hallucinations and delusions.   Bizarre or disorganized behavior.   Confusion or disorientation.  DIAGNOSIS  A diagnosis is made by an evaluation of your symptoms. There are no medical or lab tests that lead to a diagnosis, but there are various questionnaires that a health care provider may use to identify those with the baby blues, postpartum depression, or psychosis. Often, a screening tool called the [place] Postnatal Depression Scale is used to diagnose depression in the postpartum period.  TREATMENT The baby blues usually goes away on its own in 1-2 weeks. Social support is often all that is needed. You will be encouraged to get adequate sleep and rest. Occasionally, you may be given medicines to help you sleep.  Postpartum depression requires treatment because it can last several months or longer if it is not treated. Treatment may include individual or group therapy, medicine, or both to address any social, physiological, and psychological factors that may play a role in the depression. Regular exercise, a  healthy diet, rest, and social support may also be strongly recommended.  Postpartum psychosis is more serious and needs treatment right away. Hospitalization is often needed. HOME CARE INSTRUCTIONS  Get as much rest as you can. Nap when the baby sleeps.   Exercise regularly. Some women find yoga and walking to be beneficial.   Eat a balanced and nourishing diet.   Do little things that you enjoy. Have a cup of tea, take a bubble bath, read your favorite magazine, or listen to your favorite music.  Avoid alcohol.   Ask for help with household chores, cooking, grocery shopping, or running errands as needed. Do not try to do everything.   Talk to people close to you about how you are feeling. Get support from your partner, family members, friends, or other new moms.  Try to stay positive in how you think. Think about the things you are grateful for.   Do not spend a lot of time alone.   Only take over-the-counter or prescription medicine as directed by your health care provider.  Keep all your postpartum appointments.   Let your health care provider know if you have any concerns.  SEEK MEDICAL CARE IF: You are having a reaction to or problems with your medicine. SEEK IMMEDIATE MEDICAL CARE IF:  You have suicidal feelings.   You think you may harm the baby or someone else. MAKE SURE YOU:  Understand these instructions.  Will watch your condition.  Will get help right away if you are not doing well or get worse. Document Released: 06/09/2004 Document Revised: 09/10/2013 Document Reviewed: 06/17/2013 [facility] Patient Information 2015 [place], [place]. This information is not intended to replace advice given to you by your health care provider. Make sure you discuss any questions you have with your health care provider. Breastfeeding and Mastitis Mastitis is inflammation of the breast tissue. It can occur in women who  are breastfeeding. This can make breastfeeding  painful. Mastitis will sometimes go away on its own. Your health care provider will help determine if treatment is needed. CAUSES Mastitis is often associated with a blocked milk (lactiferous) duct. This can happen when too much milk builds up in the breast. Causes of excess milk in the breast can include:  Poor latch-on. If your baby is not latched onto the breast properly, she or he may not empty your breast completely while breastfeeding.  Allowing too much time to pass between feedings.  Wearing a bra or other clothing that is too tight. This puts extra pressure on the lactiferous ducts so milk does not flow through them as it should. Mastitis can also be caused by a bacterial infection. Bacteria may enter the breast tissue through cuts or openings in the skin. In women who are breastfeeding, this may occur because of cracked or irritated skin. Cracks in the skin are often caused when your baby does not latch on properly to the breast. SIGNS AND SYMPTOMS  Swelling, redness, tenderness, and pain in an area of the breast.  Swelling of the glands under the arm on the same side.  Fever may or may not accompany mastitis. If an infection is allowed to progress, a collection of pus (abscess) may develop. DIAGNOSIS  Your health care provider can usually diagnose mastitis based on your symptoms and a physical exam. Tests may be done to help confirm the diagnosis. These may include:  Removal of pus from the breast by applying pressure to the area. This pus can be examined in the lab to determine which bacteria are present. If an abscess has developed, the fluid in the abscess can be removed with a needle. This can also be used to confirm the diagnosis and determine the bacteria present. In most cases, pus will not be present.  Blood tests to determine if your body is fighting a bacterial infection.  Mammogram or ultrasound tests to rule out other problems or diseases. TREATMENT  Mastitis that  occurs with breastfeeding will sometimes go away on its own. Your health care provider may choose to wait 24 hours after first seeing you to decide whether a prescription medicine is needed. If your symptoms are worse after 24 hours, your health care provider will likely prescribe an antibiotic medicine to treat the mastitis. He or she will determine which bacteria are most likely causing the infection and will then select an appropriate antibiotic medicine. This is sometimes changed based on the results of tests performed to identify the bacteria, or if there is no response to the antibiotic medicine selected. Antibiotic medicines are usually given by mouth. You may also be given medicine for pain. HOME CARE INSTRUCTIONS  Only take over-the-counter or prescription medicines for pain, fever, or discomfort as directed by your health care provider.  If your health care provider prescribed an antibiotic medicine, take the medicine as directed. Make sure you finish it even if you start to feel better.  Do not wear a tight or underwire bra. Wear a soft, supportive bra.  Increase your fluid intake, especially if you have a fever.  Continue to empty the breast. Your health care provider can tell you whether this milk is safe for your infant or needs to be thrown out. You may be told to stop nursing until your health care provider thinks it is safe for your baby. Use a breast pump if you are advised to stop nursing.  Keep your nipples  clean and dry.  Empty the first breast completely before going to the other breast. If your baby is not emptying your breasts completely for some reason, use a breast pump to empty your breasts.  If you go back to work, pump your breasts while at work to stay in time with your nursing schedule.  Avoid allowing your breasts to become overly filled with milk (engorged). SEEK MEDICAL CARE IF:  You have pus-like discharge from the breast.  Your symptoms do not improve with  the treatment prescribed by your health care provider within 2 days. SEEK IMMEDIATE MEDICAL CARE IF:  Your pain and swelling are getting worse.  You have pain that is not controlled with medicine.  You have a red line extending from the breast toward your armpit.  You have a fever or persistent symptoms for more than 2-3 days.  You have a fever and your symptoms suddenly get worse. MAKE SURE YOU:   Understand these instructions.  Will watch your condition.  Will get help right away if you are not doing well or get worse. Document Released: 12/31/2004 Document Revised: 09/10/2013 Document Reviewed: 04/11/2013 [facility] Patient Information 2015 [place], [place]. This information is not intended to replace advice given to you by your health care provider. Make sure you discuss any questions you have with your health care provider. Breastfeeding Deciding to breastfeed is one of the best choices you can make for you and your baby. A change in hormones during pregnancy causes your breast tissue to grow and increases the number and size of your milk ducts. These hormones also allow proteins, sugars, and fats from your blood supply to make breast milk in your milk-producing glands. Hormones prevent breast milk from being released before your baby is born as well as prompt milk flow after birth. Once breastfeeding has begun, thoughts of your baby, as well as his or her sucking or crying, can stimulate the release of milk from your milk-producing glands.  BENEFITS OF BREASTFEEDING For Your Baby  Your first milk (colostrum) helps your baby's digestive system function better.   There are antibodies in your milk that help your baby fight off infections.   Your baby has a lower incidence of asthma, allergies, and sudden infant death syndrome.   The nutrients in breast milk are better for your baby than infant formulas and are designed uniquely for your baby's needs.   Breast milk improves your  baby's brain development.   Your baby is less likely to develop other conditions, such as childhood obesity, asthma, or type 2 diabetes mellitus.  For You   Breastfeeding helps to create a very special bond between you and your baby.   Breastfeeding is convenient. Breast milk is always available at the correct temperature and costs nothing.   Breastfeeding helps to burn calories and helps you lose the weight gained during pregnancy.   Breastfeeding makes your uterus contract to its prepregnancy size faster and slows bleeding (lochia) after you give birth.   Breastfeeding helps to lower your risk of developing type 2 diabetes mellitus, osteoporosis, and breast or ovarian cancer later in life. SIGNS THAT YOUR BABY IS HUNGRY Early Signs of Hunger  Increased alertness or activity.  Stretching.  Movement of the head from side to side.  Movement of the head and opening of the mouth when the corner of the mouth or cheek is stroked (rooting).  Increased sucking sounds, smacking lips, cooing, sighing, or squeaking.  Hand-to-mouth movements.  Increased sucking of  fingers or hands. Late Signs of Hunger  Fussing.  Intermittent crying. Extreme Signs of Hunger Signs of extreme hunger will require calming and consoling before your baby will be able to breastfeed successfully. Do not wait for the following signs of extreme hunger to occur before you initiate breastfeeding:   Restlessness.  A loud, strong cry.   Screaming. BREASTFEEDING BASICS Breastfeeding Initiation  Find a comfortable place to sit or lie down, with your neck and back well supported.  Place a pillow or rolled up blanket under your baby to bring him or her to the level of your breast (if you are seated). Nursing pillows are specially designed to help support your arms and your baby while you breastfeed.  Make sure that your baby's abdomen is facing your abdomen.   Gently massage your breast. With your  fingertips, massage from your chest wall toward your nipple in a circular motion. This encourages milk flow. You may need to continue this action during the feeding if your milk flows slowly.  Support your breast with 4 fingers underneath and your thumb above your nipple. Make sure your fingers are well away from your nipple and your baby's mouth.   Stroke your baby's lips gently with your finger or nipple.   When your baby's mouth is open wide enough, quickly bring your baby to your breast, placing your entire nipple and as much of the colored area around your nipple (areola) as possible into your baby's mouth.   More areola should be visible above your baby's upper lip than below the lower lip.   Your baby's tongue should be between his or her lower gum and your breast.   Ensure that your baby's mouth is correctly positioned around your nipple (latched). Your baby's lips should create a seal on your breast and be turned out (everted).  It is common for your baby to suck about 2-3 minutes in order to start the flow of breast milk. Latching Teaching your baby how to latch on to your breast properly is very important. An improper latch can cause nipple pain and decreased milk supply for you and poor weight gain in your baby. Also, if your baby is not latched onto your nipple properly, he or she may swallow some air during feeding. This can make your baby fussy. Burping your baby when you switch breasts during the feeding can help to get rid of the air. However, teaching your baby to latch on properly is still the best way to prevent fussiness from swallowing air while breastfeeding. Signs that your baby has successfully latched on to your nipple:    Silent tugging or silent sucking, without causing you pain.   Swallowing heard between every 3-4 sucks.    Muscle movement above and in front of his or her ears while sucking.  Signs that your baby has not successfully latched on to  nipple:   Sucking sounds or smacking sounds from your baby while breastfeeding.  Nipple pain. If you think your baby has not latched on correctly, slip your finger into the corner of your baby's mouth to break the suction and place it between your baby's gums. Attempt breastfeeding initiation again. Signs of Successful Breastfeeding Signs from your baby:   A gradual decrease in the number of sucks or complete cessation of sucking.   Falling asleep.   Relaxation of his or her body.   Retention of a small amount of milk in his or her mouth.   Letting go  of your breast by himself or herself. Signs from you:  Breasts that have increased in firmness, weight, and size 1-3 hours after feeding.   Breasts that are softer immediately after breastfeeding.  Increased milk volume, as well as a change in milk consistency and color by the fifth day of breastfeeding.   Nipples that are not sore, cracked, or bleeding. Signs That Your [employer]el Books is Getting Enough Milk  Wetting at least 3 diapers in a 24-hour period. The urine should be clear and pale yellow by age 633 days.  At least 3 stools in a 24-hour period by age 633 days. The stool should be soft and yellow.  At least 3 stools in a 24-hour period by age 63 days. The stool should be seedy and yellow.  No loss of weight greater than 10% of birth weight during the first 72 days of age.  Average weight gain of 4-7 ounces (113-198 g) per week after age 60 days.  Consistent daily weight gain by age 16 days, without weight loss after the age of 2 weeks. After a feeding, your baby may spit up a small amount. This is common. BREASTFEEDING FREQUENCY AND DURATION Frequent feeding will help you make more milk and can prevent sore nipples and breast engorgement. Breastfeed when you feel the need to reduce the fullness of your breasts or when your baby shows signs of hunger. This is called "breastfeeding on demand." Avoid introducing a pacifier to your  baby while you are working to establish breastfeeding (the first 4-6 weeks after your baby is born). After this time you may choose to use a pacifier. Research has shown that pacifier use during the first year of a baby's life decreases the risk of sudden infant death syndrome (SIDS). Allow your baby to feed on each breast as long as he or she wants. Breastfeed until your baby is finished feeding. When your baby unlatches or falls asleep while feeding from the first breast, offer the second breast. Because newborns are often sleepy in the first few weeks of life, you may need to awaken your baby to get him or her to feed. Breastfeeding times will vary from baby to baby. However, the following rules can serve as a guide to help you ensure that your baby is properly fed:  Newborns (babies 15 weeks of age or younger) may breastfeed every 1-3 hours.  Newborns should not go longer than 3 hours during the day or 5 hours during the night without breastfeeding.  You should breastfeed your baby a minimum of 8 times in a 24-hour period until you begin to introduce solid foods to your baby at around 19 months of age. BREAST MILK PUMPING Pumping and storing breast milk allows you to ensure that your baby is exclusively fed your breast milk, even at times when you are unable to breastfeed. This is especially important if you are going back to work while you are still breastfeeding or when you are not able to be present during feedings. Your lactation consultant can give you guidelines on how long it is safe to store breast milk.  A breast pump is a machine that allows you to pump milk from your breast into a sterile bottle. The pumped breast milk can then be stored in a refrigerator or freezer. Some breast pumps are operated by hand, while others use electricity. Ask your lactation consultant which type will work best for you. Breast pumps can be purchased, but some hospitals and breastfeeding support groups  lease  breast pumps on a monthly basis. A lactation consultant can teach you how to hand express breast milk, if you prefer not to use a pump.  CARING FOR YOUR BREASTS WHILE YOU BREASTFEED Nipples can become dry, cracked, and sore while breastfeeding. The following recommendations can help keep your breasts moisturized and healthy:  Avoid using soap on your nipples.   Wear a supportive bra. Although not required, special nursing bras and tank tops are designed to allow access to your breasts for breastfeeding without taking off your entire bra or top. Avoid wearing underwire-style bras or extremely tight bras.  Air dry your nipples for 3-52minutes after each feeding.   Use only cotton bra pads to absorb leaked breast milk. Leaking of breast milk between feedings is normal.   Use lanolin on your nipples after breastfeeding. Lanolin helps to maintain your skin's normal moisture barrier. If you use pure lanolin, you do not need to wash it off before feeding your baby again. Pure lanolin is not toxic to your baby. You may also hand express a few drops of breast milk and gently massage that milk into your nipples and allow the milk to air dry. In the first few weeks after giving birth, some women experience extremely full breasts (engorgement). Engorgement can make your breasts feel heavy, warm, and tender to the touch. Engorgement peaks within 3-5 days after you give birth. The following recommendations can help ease engorgement:  Completely empty your breasts while breastfeeding or pumping. You may want to start by applying warm, moist heat (in the shower or with warm water-soaked hand towels) just before feeding or pumping. This increases circulation and helps the milk flow. If your baby does not completely empty your breasts while breastfeeding, pump any extra milk after he or she is finished.  Wear a snug bra (nursing or regular) or tank top for 1-2 days to signal your body to slightly decrease milk  production.  Apply ice packs to your breasts, unless this is too uncomfortable for you.  Make sure that your baby is latched on and positioned properly while breastfeeding. If engorgement persists after 48 hours of following these recommendations, contact your health care provider or a Science writer. OVERALL HEALTH CARE RECOMMENDATIONS WHILE BREASTFEEDING  Eat healthy foods. Alternate between meals and snacks, eating 3 of each per day. Because what you eat affects your breast milk, some of the foods may make your baby more irritable than usual. Avoid eating these foods if you are sure that they are negatively affecting your baby.  Drink milk, fruit juice, and water to satisfy your thirst (about 10 glasses a day).   Rest often, relax, and continue to take your prenatal vitamins to prevent fatigue, stress, and anemia.  Continue breast self-awareness checks.  Avoid chewing and smoking tobacco.  Avoid alcohol and drug use. Some medicines that may be harmful to your baby can pass through breast milk. It is important to ask your health care provider before taking any medicine, including all over-the-counter and prescription medicine as well as vitamin and herbal supplements. It is possible to become pregnant while breastfeeding. If birth control is desired, ask your health care provider about options that will be safe for your baby. SEEK MEDICAL CARE IF:   You feel like you want to stop breastfeeding or have become frustrated with breastfeeding.  You have painful breasts or nipples.  Your nipples are cracked or bleeding.  Your breasts are red, tender, or warm.  You have  a swollen area on either breast.  You have a fever or chills.  You have nausea or vomiting.  You have drainage other than breast milk from your nipples.  Your breasts do not become full before feedings by the fifth day after you give birth.  You feel sad and depressed.  Your baby is too sleepy to eat  well.  Your baby is having trouble sleeping.   Your baby is wetting less than 3 diapers in a 24-hour period.  Your baby has less than 3 stools in a 24-hour period.  Your baby's skin or the white part of his or her eyes becomes yellow.   Your baby is not gaining weight by 26 days of age. SEEK IMMEDIATE MEDICAL CARE IF:   Your baby is overly tired (lethargic) and does not want to wake up and feed.  Your baby develops an unexplained fever. Document Released: 09/05/2005 Document Revised: 09/10/2013 Document Reviewed: 02/27/2013 [facility] Patient Information 2015 [place], [place]. This information is not intended to replace advice given to you by your health care provider. Make sure you discuss any questions you have with your health care provider.

## 2015-02-11 NOTE — Progress Notes (Addendum)
POSTOPERATIVE DAY # 2 S/P cesarean section-repeat   S:         Reports feeling ok             Tolerating po intake / no nausea / no vomiting / + flatus / no BM             Bleeding is light             Pain controlled with motrin and percocet             Up ad lib / ambulatory/ voiding QS  Newborn breast-feeding  / Circumcision done  O:  VS: BP 115/83 mmHg  Pulse 70  Temp(Src) 97.4 F (36.3 C) (Oral)  Resp 18  Ht 5\' 3"  (1.6 m)  Wt 87.091 kg (192 lb)  BMI 34.02 kg/m2  SpO2 97%  LMP 06/12/2014  Breastfeeding   LABS:                Recent Labs  02/10/15 0635  WBC 7.6  HGB 11.2*  PLT 171               Bloodtype: --/--/B POS (05/20 1550)  Rubella: Immune (05/19 0000)                                             I&O: Intake/Output      05/24 0701 - 05/25 0700 05/25 0701 - 05/26 0700   P.O.     I.V. (mL/kg)     Total Intake(mL/kg)     Urine (mL/kg/hr) 450 (0.2)    Blood     Total Output 450     Net -450                       Physical Exam:             Alert and Oriented x 3  Lungs: Clear and unlabored  Heart: regular rate and rhythm / no mumurs  Abdomen: soft, non-tender, non-distended             Fundus: firm, non-tender, U-1             Dressing intact              Incision:  Skin well- approximated with sutures / no erythema / no ecchymosis / no drainage  Perineum: intact  Lochia: light  Extremities: No edema, no calf pain or tenderness  A:        POD # 2 / G2 P2002 / S/P Repeat Cesarean Delivery            Anxiety  Contact Dermatitis  P:        Routine postoperative care   Continue Zoloft 50 mg daily   Start Hydrocortisone Cream 1% apply to irritated area around incision 2 times daily - AVOID applying to incision             Early DC today  F/U with Dr. Ronita Hipps in 2 wks for PPD evaluation & in 6 wks for regular PP visit     Laury Deep, M MSN, CNM  02/11/2015, 8:44 AM

## 2015-02-18 ENCOUNTER — Ambulatory Visit (HOSPITAL_COMMUNITY): Payer: No Typology Code available for payment source

## 2015-02-26 ENCOUNTER — Encounter (HOSPITAL_COMMUNITY)
Admission: RE | Admit: 2015-02-26 | Discharge: 2015-02-26 | Disposition: A | Discharge status: Home / Self Care | Payer: No Typology Code available for payment source | Source: Ambulatory Visit | Attending: Obstetrics and Gynecology | Admitting: Obstetrics and Gynecology

## 2015-02-26 DIAGNOSIS — O923 Agalactia: Secondary | ICD-10-CM | POA: Diagnosis present

## 2015-03-29 ENCOUNTER — Encounter (HOSPITAL_COMMUNITY)
Admission: RE | Admit: 2015-03-29 | Discharge: 2015-03-29 | Disposition: A | Discharge status: Home / Self Care | Payer: No Typology Code available for payment source | Source: Ambulatory Visit | Attending: Obstetrics and Gynecology | Admitting: Obstetrics and Gynecology

## 2015-03-29 DIAGNOSIS — O923 Agalactia: Secondary | ICD-10-CM | POA: Insufficient documentation

## 2015-04-28 ENCOUNTER — Encounter (HOSPITAL_COMMUNITY)
Admission: RE | Admit: 2015-04-28 | Discharge: 2015-04-28 | Disposition: A | Discharge status: Home / Self Care | Payer: No Typology Code available for payment source | Source: Ambulatory Visit | Attending: Obstetrics and Gynecology | Admitting: Obstetrics and Gynecology

## 2015-04-28 DIAGNOSIS — O923 Agalactia: Secondary | ICD-10-CM | POA: Insufficient documentation

## 2015-05-29 ENCOUNTER — Encounter (HOSPITAL_COMMUNITY)
Admission: RE | Admit: 2015-05-29 | Discharge: 2015-05-29 | Disposition: A | Discharge status: Home / Self Care | Payer: No Typology Code available for payment source | Source: Ambulatory Visit | Attending: Obstetrics and Gynecology | Admitting: Obstetrics and Gynecology

## 2015-05-29 DIAGNOSIS — O923 Agalactia: Secondary | ICD-10-CM | POA: Insufficient documentation

## 2015-06-26 ENCOUNTER — Encounter (HOSPITAL_COMMUNITY)
Admission: RE | Admit: 2015-06-26 | Discharge: 2015-06-26 | Disposition: A | Discharge status: Home / Self Care | Payer: No Typology Code available for payment source | Source: Ambulatory Visit | Attending: Obstetrics and Gynecology | Admitting: Obstetrics and Gynecology

## 2015-06-26 DIAGNOSIS — O923 Agalactia: Secondary | ICD-10-CM | POA: Insufficient documentation

## 2015-07-26 ENCOUNTER — Encounter (HOSPITAL_COMMUNITY)
Admission: RE | Admit: 2015-07-26 | Discharge: 2015-07-26 | Disposition: A | Discharge status: Home / Self Care | Payer: No Typology Code available for payment source | Source: Ambulatory Visit | Attending: Obstetrics and Gynecology | Admitting: Obstetrics and Gynecology

## 2015-07-26 DIAGNOSIS — O923 Agalactia: Secondary | ICD-10-CM | POA: Insufficient documentation

## 2015-08-25 ENCOUNTER — Encounter (HOSPITAL_COMMUNITY)
Admission: RE | Admit: 2015-08-25 | Discharge: 2015-08-25 | Disposition: A | Discharge status: Home / Self Care | Payer: No Typology Code available for payment source | Source: Ambulatory Visit | Attending: Obstetrics and Gynecology | Admitting: Obstetrics and Gynecology

## 2015-08-25 DIAGNOSIS — O923 Agalactia: Secondary | ICD-10-CM | POA: Insufficient documentation

## 2015-09-25 ENCOUNTER — Encounter (HOSPITAL_COMMUNITY)
Admission: RE | Admit: 2015-09-25 | Discharge: 2015-09-25 | Disposition: A | Discharge status: Home / Self Care | Payer: No Typology Code available for payment source | Source: Ambulatory Visit | Attending: Obstetrics and Gynecology | Admitting: Obstetrics and Gynecology

## 2015-09-25 DIAGNOSIS — O923 Agalactia: Secondary | ICD-10-CM | POA: Insufficient documentation

## 2015-10-28 ENCOUNTER — Encounter (HOSPITAL_COMMUNITY)
Admission: RE | Admit: 2015-10-28 | Discharge: 2015-10-28 | Disposition: A | Discharge status: Home / Self Care | Payer: No Typology Code available for payment source | Source: Ambulatory Visit | Attending: Obstetrics and Gynecology | Admitting: Obstetrics and Gynecology

## 2015-10-28 DIAGNOSIS — O923 Agalactia: Secondary | ICD-10-CM | POA: Insufficient documentation

## 2015-11-27 ENCOUNTER — Encounter (HOSPITAL_COMMUNITY)
Admission: RE | Admit: 2015-11-27 | Discharge: 2015-11-27 | Disposition: A | Discharge status: Home / Self Care | Payer: No Typology Code available for payment source | Source: Ambulatory Visit | Attending: Obstetrics and Gynecology | Admitting: Obstetrics and Gynecology

## 2015-11-27 DIAGNOSIS — O923 Agalactia: Secondary | ICD-10-CM | POA: Insufficient documentation

## 2015-12-28 ENCOUNTER — Encounter (HOSPITAL_COMMUNITY)
Admission: RE | Admit: 2015-12-28 | Discharge: 2015-12-28 | Disposition: A | Discharge status: Home / Self Care | Payer: No Typology Code available for payment source | Source: Ambulatory Visit | Attending: Obstetrics and Gynecology | Admitting: Obstetrics and Gynecology

## 2015-12-28 DIAGNOSIS — O923 Agalactia: Secondary | ICD-10-CM | POA: Insufficient documentation

## 2016-01-27 ENCOUNTER — Encounter (HOSPITAL_COMMUNITY)
Admission: RE | Admit: 2016-01-27 | Discharge: 2016-01-27 | Disposition: A | Discharge status: Home / Self Care | Payer: No Typology Code available for payment source | Source: Ambulatory Visit | Attending: Obstetrics and Gynecology | Admitting: Obstetrics and Gynecology

## 2016-01-27 DIAGNOSIS — O923 Agalactia: Secondary | ICD-10-CM | POA: Insufficient documentation

## 2016-02-28 ENCOUNTER — Encounter (HOSPITAL_COMMUNITY)
Admission: RE | Admit: 2016-02-28 | Discharge: 2016-02-28 | Disposition: A | Discharge status: Home / Self Care | Payer: No Typology Code available for payment source | Source: Ambulatory Visit | Attending: Obstetrics and Gynecology | Admitting: Obstetrics and Gynecology

## 2016-02-28 DIAGNOSIS — O923 Agalactia: Secondary | ICD-10-CM | POA: Insufficient documentation

## 2016-03-29 ENCOUNTER — Encounter (HOSPITAL_COMMUNITY)
Admission: RE | Admit: 2016-03-29 | Discharge: 2016-03-29 | Disposition: A | Discharge status: Home / Self Care | Payer: No Typology Code available for payment source | Source: Ambulatory Visit | Attending: Obstetrics and Gynecology | Admitting: Obstetrics and Gynecology

## 2016-04-28 ENCOUNTER — Encounter (HOSPITAL_COMMUNITY)
Admission: RE | Admit: 2016-04-28 | Discharge: 2016-04-28 | Disposition: A | Discharge status: Home / Self Care | Payer: No Typology Code available for payment source | Source: Ambulatory Visit | Attending: Obstetrics and Gynecology | Admitting: Obstetrics and Gynecology

## 2016-05-30 ENCOUNTER — Encounter (HOSPITAL_COMMUNITY)
Admission: RE | Admit: 2016-05-30 | Discharge: 2016-05-30 | Disposition: A | Discharge status: Home / Self Care | Payer: No Typology Code available for payment source | Source: Ambulatory Visit | Attending: Obstetrics and Gynecology | Admitting: Obstetrics and Gynecology

## 2016-06-29 ENCOUNTER — Encounter (HOSPITAL_COMMUNITY)
Admission: RE | Admit: 2016-06-29 | Discharge: 2016-06-29 | Disposition: A | Discharge status: Home / Self Care | Payer: No Typology Code available for payment source | Source: Ambulatory Visit | Attending: Obstetrics and Gynecology | Admitting: Obstetrics and Gynecology

## 2016-07-29 ENCOUNTER — Encounter (HOSPITAL_COMMUNITY)
Admission: RE | Admit: 2016-07-29 | Discharge: 2016-07-29 | Disposition: A | Discharge status: Home / Self Care | Payer: No Typology Code available for payment source | Source: Ambulatory Visit | Attending: Obstetrics and Gynecology | Admitting: Obstetrics and Gynecology

## 2016-08-28 ENCOUNTER — Ambulatory Visit (HOSPITAL_COMMUNITY)
Admission: RE | Admit: 2016-08-28 | Discharge: 2016-08-28 | Disposition: A | Discharge status: Home / Self Care | Payer: No Typology Code available for payment source | Source: Ambulatory Visit | Attending: Obstetrics and Gynecology | Admitting: Obstetrics and Gynecology

## 2016-09-27 ENCOUNTER — Ambulatory Visit (HOSPITAL_COMMUNITY)
Admission: RE | Admit: 2016-09-27 | Discharge: 2016-09-27 | Disposition: A | Discharge status: Home / Self Care | Payer: No Typology Code available for payment source | Source: Ambulatory Visit | Attending: Obstetrics and Gynecology | Admitting: Obstetrics and Gynecology

## 2018-11-14 LAB — OB RESULTS CONSOLE ABO/RH: RH Type: POSITIVE

## 2018-11-14 LAB — OB RESULTS CONSOLE HIV ANTIBODY (ROUTINE TESTING): HIV: NONREACTIVE

## 2018-11-14 LAB — OB RESULTS CONSOLE RPR: RPR: NONREACTIVE

## 2018-11-14 LAB — OB RESULTS CONSOLE RUBELLA ANTIBODY, IGM: Rubella: IMMUNE

## 2018-11-14 LAB — OB RESULTS CONSOLE HEPATITIS B SURFACE ANTIGEN: Hepatitis B Surface Ag: NEGATIVE

## 2018-11-14 LAB — OB RESULTS CONSOLE GC/CHLAMYDIA
Chlamydia: NEGATIVE
Gonorrhea: NEGATIVE

## 2018-11-14 LAB — OB RESULTS CONSOLE ANTIBODY SCREEN: Antibody Screen: NEGATIVE

## 2019-03-27 ENCOUNTER — Encounter (HOSPITAL_COMMUNITY): Payer: Self-pay | Admitting: Obstetrics and Gynecology

## 2019-03-29 ENCOUNTER — Other Ambulatory Visit (HOSPITAL_COMMUNITY): Payer: Self-pay | Admitting: Obstetrics and Gynecology

## 2019-03-29 DIAGNOSIS — O365931 Maternal care for other known or suspected poor fetal growth, third trimester, fetus 1: Secondary | ICD-10-CM

## 2019-03-29 DIAGNOSIS — Z3A28 28 weeks gestation of pregnancy: Secondary | ICD-10-CM

## 2019-03-29 DIAGNOSIS — Z3689 Encounter for other specified antenatal screening: Secondary | ICD-10-CM

## 2019-04-03 DIAGNOSIS — Z3689 Encounter for other specified antenatal screening: Secondary | ICD-10-CM | POA: Diagnosis not present

## 2019-04-05 ENCOUNTER — Encounter (HOSPITAL_COMMUNITY): Payer: Self-pay | Admitting: *Deleted

## 2019-04-08 ENCOUNTER — Encounter (HOSPITAL_COMMUNITY): Payer: Self-pay

## 2019-04-09 ENCOUNTER — Encounter (HOSPITAL_COMMUNITY): Payer: Self-pay

## 2019-04-09 ENCOUNTER — Encounter (HOSPITAL_COMMUNITY): Payer: Self-pay | Admitting: *Deleted

## 2019-04-09 ENCOUNTER — Ambulatory Visit (HOSPITAL_COMMUNITY)
Admission: RE | Admit: 2019-04-09 | Discharge: 2019-04-09 | Disposition: A | Discharge status: Home / Self Care | Payer: Medicaid Other | Source: Ambulatory Visit | Attending: Obstetrics and Gynecology | Admitting: Obstetrics and Gynecology

## 2019-04-09 ENCOUNTER — Observation Stay (HOSPITAL_COMMUNITY)
Admission: AD | Admit: 2019-04-09 | Discharge: 2019-04-10 | Disposition: A | Discharge status: Home / Self Care | Payer: No Typology Code available for payment source | Source: Ambulatory Visit | Attending: Obstetrics and Gynecology | Admitting: Obstetrics and Gynecology

## 2019-04-09 ENCOUNTER — Ambulatory Visit (HOSPITAL_BASED_OUTPATIENT_CLINIC_OR_DEPARTMENT_OTHER): Payer: Medicaid Other | Admitting: *Deleted

## 2019-04-09 ENCOUNTER — Other Ambulatory Visit: Payer: Self-pay

## 2019-04-09 ENCOUNTER — Ambulatory Visit (HOSPITAL_COMMUNITY): Payer: Medicaid Other | Admitting: *Deleted

## 2019-04-09 VITALS — BP 139/105 | HR 94 | Temp 98.4°F

## 2019-04-09 VITALS — BP 149/101

## 2019-04-09 DIAGNOSIS — O10913 Unspecified pre-existing hypertension complicating pregnancy, third trimester: Secondary | ICD-10-CM | POA: Diagnosis not present

## 2019-04-09 DIAGNOSIS — O365990 Maternal care for other known or suspected poor fetal growth, unspecified trimester, not applicable or unspecified: Secondary | ICD-10-CM | POA: Diagnosis not present

## 2019-04-09 DIAGNOSIS — O365931 Maternal care for other known or suspected poor fetal growth, third trimester, fetus 1: Secondary | ICD-10-CM | POA: Insufficient documentation

## 2019-04-09 DIAGNOSIS — O34219 Maternal care for unspecified type scar from previous cesarean delivery: Secondary | ICD-10-CM | POA: Diagnosis not present

## 2019-04-09 DIAGNOSIS — O133 Gestational [pregnancy-induced] hypertension without significant proteinuria, third trimester: Secondary | ICD-10-CM | POA: Diagnosis not present

## 2019-04-09 DIAGNOSIS — R51 Headache: Secondary | ICD-10-CM | POA: Diagnosis not present

## 2019-04-09 DIAGNOSIS — O26893 Other specified pregnancy related conditions, third trimester: Secondary | ICD-10-CM | POA: Insufficient documentation

## 2019-04-09 DIAGNOSIS — Z3A28 28 weeks gestation of pregnancy: Secondary | ICD-10-CM | POA: Diagnosis not present

## 2019-04-09 DIAGNOSIS — O10919 Unspecified pre-existing hypertension complicating pregnancy, unspecified trimester: Secondary | ICD-10-CM

## 2019-04-09 DIAGNOSIS — O3429 Maternal care due to uterine scar from other previous surgery: Secondary | ICD-10-CM | POA: Diagnosis not present

## 2019-04-09 DIAGNOSIS — F419 Anxiety disorder, unspecified: Secondary | ICD-10-CM | POA: Diagnosis not present

## 2019-04-09 DIAGNOSIS — Z1159 Encounter for screening for other viral diseases: Secondary | ICD-10-CM | POA: Insufficient documentation

## 2019-04-09 DIAGNOSIS — Z3A29 29 weeks gestation of pregnancy: Secondary | ICD-10-CM

## 2019-04-09 DIAGNOSIS — O99343 Other mental disorders complicating pregnancy, third trimester: Secondary | ICD-10-CM | POA: Insufficient documentation

## 2019-04-09 DIAGNOSIS — O365930 Maternal care for other known or suspected poor fetal growth, third trimester, not applicable or unspecified: Secondary | ICD-10-CM | POA: Diagnosis not present

## 2019-04-09 DIAGNOSIS — Z3689 Encounter for other specified antenatal screening: Secondary | ICD-10-CM

## 2019-04-09 DIAGNOSIS — Z8249 Family history of ischemic heart disease and other diseases of the circulatory system: Secondary | ICD-10-CM | POA: Insufficient documentation

## 2019-04-09 DIAGNOSIS — H539 Unspecified visual disturbance: Secondary | ICD-10-CM

## 2019-04-09 DIAGNOSIS — O09529 Supervision of elderly multigravida, unspecified trimester: Secondary | ICD-10-CM | POA: Insufficient documentation

## 2019-04-09 LAB — COMPREHENSIVE METABOLIC PANEL
ALT: 19 U/L (ref 0–44)
AST: 22 U/L (ref 15–41)
Albumin: 2.8 g/dL — ABNORMAL LOW (ref 3.5–5.0)
Alkaline Phosphatase: 102 U/L (ref 38–126)
Anion gap: 11 (ref 5–15)
BUN: 8 mg/dL (ref 6–20)
CO2: 22 mmol/L (ref 22–32)
Calcium: 9.5 mg/dL (ref 8.9–10.3)
Chloride: 102 mmol/L (ref 98–111)
Creatinine, Ser: 0.71 mg/dL (ref 0.44–1.00)
GFR calc Af Amer: >60 mL/min (ref >60)
GFR calc non Af Amer: >60 mL/min (ref >60)
Glucose, Bld: 133 mg/dL — ABNORMAL HIGH (ref 70–99)
Potassium: 3.6 mmol/L (ref 3.5–5.1)
Sodium: 135 mmol/L (ref 135–145)
Total Bilirubin: 0.6 mg/dL (ref 0.3–1.2)
Total Protein: 5.7 g/dL — ABNORMAL LOW (ref 6.5–8.1)

## 2019-04-09 LAB — CBC
HCT: 41.2 % (ref 36.0–46.0)
Hemoglobin: 14.3 g/dL (ref 12.0–15.0)
MCH: 30.4 pg (ref 26.0–34.0)
MCHC: 34.7 g/dL (ref 30.0–36.0)
MCV: 87.5 fL (ref 80.0–100.0)
Platelets: 235 10*3/uL (ref 150–400)
RBC: 4.71 MIL/uL (ref 3.87–5.11)
RDW: 12.8 % (ref 11.5–15.5)
WBC: 9 10*3/uL (ref 4.0–10.5)
nRBC: 0 % (ref 0.0–0.2)

## 2019-04-09 LAB — PROTEIN / CREATININE RATIO, URINE
Creatinine, Urine: 119.44 mg/dL
Protein Creatinine Ratio: 0.11 mg/mg{Cre} (ref 0.00–0.15)
Total Protein, Urine: 13 mg/dL

## 2019-04-09 LAB — TYPE AND SCREEN
ABO/RH(D): B POS
Antibody Screen: NEGATIVE

## 2019-04-09 LAB — ABO/RH: ABO/RH(D): B POS

## 2019-04-09 LAB — SARS CORONAVIRUS 2 BY RT PCR (HOSPITAL ORDER, PERFORMED IN ~~LOC~~ HOSPITAL LAB): SARS Coronavirus 2: NEGATIVE

## 2019-04-09 MED ORDER — ACETAMINOPHEN 500 MG PO TABS
1000.0000 mg | ORAL_TABLET | Freq: Four times a day (QID) | ORAL | Status: DC | PRN
  Administered 2019-04-09 – 2019-04-10 (×3): 1000 mg via ORAL
  Filled 2019-04-09 (×3): qty 2

## 2019-04-09 MED ORDER — PRENATAL MULTIVITAMIN CH: 1.0000 | ORAL_TABLET | Freq: Every day | ORAL | Status: DC

## 2019-04-09 MED ORDER — BETAMETHASONE SOD PHOS & ACET 6 (3-3) MG/ML IJ SUSP
12.0000 mg | INTRAMUSCULAR | Status: DC
  Administered 2019-04-09: 12 mg via INTRAMUSCULAR
  Filled 2019-04-09 (×3): qty 2

## 2019-04-09 MED ORDER — SODIUM CHLORIDE 0.9% FLUSH
3.0000 mL | INTRAVENOUS | Status: DC | PRN
  Administered 2019-04-09: 3 mL via INTRAVENOUS
  Filled 2019-04-09: qty 3

## 2019-04-09 MED ORDER — SODIUM CHLORIDE 0.9% FLUSH: 3.0000 mL | Freq: Two times a day (BID) | INTRAVENOUS | Status: DC

## 2019-04-09 MED ORDER — LABETALOL HCL 100 MG PO TABS
100.0000 mg | ORAL_TABLET | Freq: Two times a day (BID) | ORAL | Status: DC
  Administered 2019-04-09 – 2019-04-10 (×2): 100 mg via ORAL
  Filled 2019-04-09 (×2): qty 1

## 2019-04-09 MED ORDER — SERTRALINE HCL 50 MG PO TABS
50.0000 mg | ORAL_TABLET | Freq: Every day | ORAL | Status: DC
  Administered 2019-04-09: 22:00:00 50 mg via ORAL
  Filled 2019-04-09: qty 1

## 2019-04-09 MED ORDER — DOCUSATE SODIUM 100 MG PO CAPS: 100.0000 mg | ORAL_CAPSULE | Freq: Every day | ORAL | Status: DC

## 2019-04-09 MED ORDER — CALCIUM CARBONATE ANTACID 500 MG PO CHEW
2.0000 | CHEWABLE_TABLET | ORAL | Status: DC | PRN
  Administered 2019-04-10: 03:00:00 400 mg via ORAL
  Filled 2019-04-09: qty 2

## 2019-04-09 MED ORDER — ZOLPIDEM TARTRATE 5 MG PO TABS: 5.0000 mg | ORAL_TABLET | Freq: Every evening | ORAL | Status: DC | PRN

## 2019-04-09 NOTE — MAU Provider Note (Signed)
Chief Complaint  Patient presents with  . Headache  . Hypertension  . seeing spots     First Provider Initiated Contact with Patient 04/09/19 1801      S: Sally Edwards  is a 33 y.o. y.o. year old G67P2002 female at [redacted]w[redacted]d weeks gestation who was sent to MAU with elevated blood pressures at MFM of 139/105, 162/101, 149/101. No Hx HTN. Current blood pressure medication: None. Gets PNC at [facility]. Had MFM [place] for growth restriction., EFW today was 1%tile, Nml dopplers. Increase swelling of hands and feet over the past week.   Associated symptoms: Frontal 5/10 Headache, positive vision changes (blurry vision x a few days), denies epigastric pain. Hasn't tried anything for HA.  Contractions: denies Vaginal bleeding: denies Fetal movement: Nml  O:  Patient Vitals for the past 24 hrs:  BP Temp Temp src Pulse Resp SpO2 Height Weight  04/09/19 1830 (!) 147/99 - - 99 18 99 % - -  04/09/19 1815 (!) 148/107 - - (!) 106 18 98 % - -  04/09/19 1810 - - - - - 98 % - -  04/09/19 1751 - - - - - - 5\' 3"  (1.6 m) -  04/09/19 1741 (!) 147/94 98 F (36.7 C) Axillary (!) 108 18 - - -  04/09/19 1725 (!) 149/89 - Oral (!) 113 - 99 % - 88.5 kg   General: NAD Heart: Regular rate Lungs: Normal rate and effort Abd: Soft, NT, Gravid, S=D Extremities: Edema on both hands. 2+ Pedal edema Neuro: 2+ deep tendon reflexes, No clonus Pelvic: Deferred      EFM: 150, min-Moderate variability, 15 x 15 accelerations, 1 prolonged decel and few variable decelerations Toco: None  Results for orders placed or performed during the hospital encounter of 04/09/19 (from the past 24 hour(s))  Protein / creatinine ratio, urine     Status: None   Collection Time: 04/09/19  5:41 PM  Result Value Ref Range   Creatinine, Urine 119.44 mg/dL   Total Protein, Urine 13 mg/dL   Protein Creatinine Ratio 0.11 0.00 - 0.15 mg/mg[Cre]  CBC     Status: None   Collection Time: 04/09/19  5:50 PM  Result Value Ref Range   WBC  9.0 4.0 - 10.5 K/uL   RBC 4.71 3.87 - 5.11 MIL/uL   Hemoglobin 14.3 12.0 - 15.0 g/dL   HCT 41.2 36.0 - 46.0 %   MCV 87.5 80.0 - 100.0 fL   MCH 30.4 26.0 - 34.0 pg   MCHC 34.7 30.0 - 36.0 g/dL   RDW 12.8 11.5 - 15.5 %   Platelets 235 150 - 400 K/uL   nRBC 0.0 0.0 - 0.2 %  Comprehensive metabolic panel     Status: Abnormal   Collection Time: 04/09/19  5:50 PM  Result Value Ref Range   Sodium 135 135 - 145 mmol/L   Potassium 3.6 3.5 - 5.1 mmol/L   Chloride 102 98 - 111 mmol/L   CO2 22 22 - 32 mmol/L   Glucose, Bld 133 (H) 70 - 99 mg/dL   BUN 8 6 - 20 mg/dL   Creatinine, Ser 0.71 0.44 - 1.00 mg/dL   Calcium 9.5 8.9 - 10.3 mg/dL   Total Protein 5.7 (L) 6.5 - 8.1 g/dL   Albumin 2.8 (L) 3.5 - 5.0 g/dL   AST 22 15 - 41 U/L   ALT 19 0 - 44 U/L   Alkaline Phosphatase 102 38 - 126 U/L   Total Bilirubin  0.6 0.3 - 1.2 mg/dL   GFR calc non Af Amer >60 >60 mL/min   GFR calc Af Amer >60 >60 mL/min   Anion gap 11 5 - 15   MDM GHTN vs Pre-E w/ severe features base on persistent blurry vision. HA Improved to 3/10 after Tylenol. Pt still reports persistent vision changes that she rates 8/10. BP's persistently elevated, but no more severe-range BP's after one in MFM. Pre-E labs Nml. Discussed w/ Dr. Nehemiah Settle who recommends observation and BMZ. Called Dr. Murrell Redden who directed CNM to call Dr. Ronita Hipps who specials this pt. Dr. Ronita Hipps agrees to Observation and BMZ.   A: [redacted]w[redacted]d week IUP Gestational hypertension vs Preeclampsia (vision changes) Pregnancy affected by fetal growth restriction  P: Observe on [facility]ty [facility] per Dr. Nehemiah Settle.  Dr. Ronita Hipps assuming care of pt. [place], [place], [place] 04/09/2019 8:41 PM

## 2019-04-09 NOTE — Progress Notes (Signed)
Patient to MAU for further assessment of elevated BP's.

## 2019-04-09 NOTE — MAU Note (Signed)
C/o Headache that started around 3:30 pm rates it 6/10 , increased swelling in hands and feet, and dizziness. Pt states she is currently seeing "spots".

## 2019-04-09 NOTE — Procedures (Signed)
Sally Edwards 1986/03/19 [redacted]w[redacted]d  Fetus A Non-Stress Test Interpretation for 04/09/19  Indication: New onset HTN  Fetal Heart Rate A Mode: External Baseline Rate (A): 135 bpm Variability: Moderate Accelerations: 10 x 10 Decelerations: None Multiple birth?: No  Uterine Activity Mode: Toco Contraction Frequency (min): None Resting Tone Palpated: Relaxed Resting Time: Adequate  Interpretation (Fetal Testing) Overall Impression: Reassuring for gestational age Comments: EFM tracing reviewed with Dr. Donalee Citrin

## 2019-04-09 NOTE — Consult Note (Signed)
Maternal-Fetal Medicine  Name: Sally Edwards MRN: [id number] Requesting Provider: Dr. Brien Few, MD  I had the pleasure of seeing Sally Edwards today at the [facility]. She is here for ultrasound and consultation because of fetal growth restriction detected at your office ultrasound.  Her prenatal course has, otherwise, been uneventful. On cell-free fetal DNA screening, the risks of fetal aneuploidies are not increased. Her blood pressures have been normal at prenatal visits. She does not have gestational diabetes. PMH: No history of hypertension or diabetes or any other chronic medical conditions. PSH: Myomectomy (laparotomy) in 2011 in [place], [place], cesarean section (x2), neck surgery in childhood (torticollis). Medications: Prenatal vitamins, Zoloft. Allergies: NKDA. Social: Denies tobacco or drug or alcohol use. She has been married 8 years and her husband is in good health. Family: Mother has hypertension. No history of venous thromboembolism.  BP at our office: 139/105, 149/101 and 162/101 mm Hg. Patient does not have symptoms of severe headache or right upper quadrant pain or vaginal bleeding. She has Edwards visual disturbances ("floaters").  On ultrasound, the estimated fetal weight (1.023 grams) is at the 1st percentile. Head circumference measurement is at between mean and -1 SD (normal) and abdominal circumference measurement is at less than the 10th percentile. Femur measures at the 1st percentile. Amniotic fluid is normal and good fetal activity is seen. Fetal anatomy appears normal, but limited by advanced gestational age. Single umbilical artery is seen. Placenta is anterior and there is no evidence of previa or accreta. Umbilical artery Doppler showed normal forward diastolic flow. Middle-cerebral artery peak-systolic velocity measurement is normal. Cerebroplacental ratio is normal. NST is reactive for this gestational age.  Fetal weight is estimated by  using Hadlock formula (consistent with SMFM recommendation). I counseled the patient on the following: Fetal growth restriction: I explained the possible causes including gestational hypertension/preeclampsia, placental insufficiency, chromosomal anomalies and infection (unlikely). I explained that fetal growth restriction can precede preeclampsia.  Single umbilical artery is seen on today's ultrasound. It can be associated with growth restriction, but management is not different.  Gestational hypertension/preeclampsia:  I explained the significance of increased blood pressure. Her symptom of visual disturbance and increased blood pressures warrant evaluation at MAU. I counseled her that preeclampsia (if confirmed) can be associated with complications including eclampsia, pulmonary edema, and end-organ damage. Inpatient management may be recommended. Timing of delivery will depend on abnormal antenatal testing and maternal status because of hypertension.  Previous myomectomy and previous cesarean section: I reassured her of the absence of previa or accreta.   Recommendations: -MAU evaluation including blood work. -Weekly UA Doppler. -BPP from 32 weeks till delivery. -Timing of delivery will be decided based on the findings of follow-up scans or maternal status. -UA Doppler at [facility] next week.  Thank you for your consult. Please do not hesitate to contact me if you have any questions or concerns.  Consultation including face-to-face counseling: 40 min.

## 2019-04-09 NOTE — H&P (Signed)
Sally Edwards is a 33 y.o. female presenting for new onset IUGR and elevated BP in MFM today.Pt has noted floaters throughout the course of pregnancy.No headache or epigastric pain. Good FM. No bleeding or LOF. OB History    Gravida  3   Para  2   Term  2   Preterm      AB      Living  2     SAB      TAB      Ectopic      Multiple  0   Live Births  2          Past Medical History:  Diagnosis Date  . Anxiety   . Complication of anesthesia 2010   "passed out" post epidural for myomectomy  . Fibroid 2010   Past Surgical History:  Procedure Laterality Date  . CESAREAN SECTION N/A 11/11/2013   Procedure: Primary CESAREAN SECTION;  Surgeon: Lovenia Kim, MD;  Location: [facility];  Service: Obstetrics;  Laterality: N/A;  EDD: 11/29/13  . CESAREAN SECTION N/A 02/09/2015   Procedure: CESAREAN SECTION REPEAT ;  Surgeon: Brien Few, MD;  Location: [facility];  Service: Obstetrics;  Laterality: N/A;  EDD: 02/27/15 Gearldine Bienenstock. RNFA confirmed with Dr. Ronita Hipps  . [place]  2010  . torticollis     Family History: family history includes Heart disease in her maternal grandfather, maternal grandmother, paternal grandfather, and paternal grandmother; Hypertension in her mother. Social History:  reports that she has never smoked. She has never used smokeless tobacco. She reports current alcohol use. She reports that she does not use drugs.     Maternal Diabetes: No Genetic Screening: Normal Maternal Ultrasounds/Referrals: IUGR Fetal Ultrasounds or other Referrals:  Referred to Materal Fetal Medicine  Maternal Substance Abuse:  No Significant Maternal Medications:  None Significant Maternal Lab Results:  None Other Comments:  None  Review of Systems  Constitutional: Negative.   Eyes: Positive for blurred vision.  All other systems reviewed and are negative.  Maternal Medical History:  Fetal activity: Perceived fetal activity is normal.   Last perceived  fetal movement was within the past hour.    Prenatal complications: IUGR.   Prenatal Complications - Diabetes: none.      Blood pressure (!) 145/99, pulse 94, temperature 98 F (36.7 C), temperature source Axillary, resp. rate 18, height 5\' 3"  (1.6 m), weight 88.5 kg, last menstrual period 09/09/2018, SpO2 98 %, unknown if currently breastfeeding. Maternal Exam:  Uterine Assessment: Contraction strength is mild.  Contraction frequency is rare.   Abdomen: Patient reports no abdominal tenderness. Surgical scars: low transverse.   Fetal presentation: vertex  Introitus: Normal vulva. Normal vagina.  Ferning test: not done.  Nitrazine test: not done. Amniotic fluid character: not assessed.  Pelvis: questionable for delivery.   Cervix: Cervix evaluated by digital exam.     Physical Exam  Nursing note and vitals reviewed. Constitutional: She is oriented to person, place, and time. She appears well-developed and well-nourished.  HENT:  Head: Normocephalic and atraumatic.  Neck: Normal range of motion.  Cardiovascular: Normal rate and regular rhythm.  Respiratory: Effort normal and breath sounds normal.  GI: Soft. Bowel sounds are normal.  Genitourinary:    Vulva, vagina and uterus normal.   Musculoskeletal: Normal range of motion.  Neurological: She is alert and oriented to person, place, and time. She has normal reflexes.  Skin: Skin is warm and dry.  Psychiatric: She has a normal mood  and affect.     CBC    Component Value Date/Time   WBC 9.0 04/09/2019 1750   RBC 4.71 04/09/2019 1750   HGB 14.3 04/09/2019 1750   HCT 41.2 04/09/2019 1750   PLT 235 04/09/2019 1750   MCV 87.5 04/09/2019 1750   MCH 30.4 04/09/2019 1750   MCHC 34.7 04/09/2019 1750   RDW 12.8 04/09/2019 1750   LYMPHSABS 3.0 08/29/2012 1307   MONOABS 0.3 08/29/2012 1307   EOSABS 0.1 08/29/2012 1307   BASOSABS 0.0 08/29/2012 1307   CMP     Component Value Date/Time   NA 135 04/09/2019 1750   K 3.6  04/09/2019 1750   CL 102 04/09/2019 1750   CO2 22 04/09/2019 1750   GLUCOSE 133 (H) 04/09/2019 1750   BUN 8 04/09/2019 1750   CREATININE 0.71 04/09/2019 1750   CALCIUM 9.5 04/09/2019 1750   PROT 5.7 (L) 04/09/2019 1750   ALBUMIN 2.8 (L) 04/09/2019 1750   AST 22 04/09/2019 1750   ALT 19 04/09/2019 1750   ALKPHOS 102 04/09/2019 1750   BILITOT 0.6 04/09/2019 1750   GFRNONAA >60 04/09/2019 1750   GFRAA >60 04/09/2019 1750   Nl Urine PC ration  Prenatal labs: ABO, Rh:   Antibody:   Rubella:   RPR:    HBsAg:    HIV:    GBS:     Assessment/Plan: 29 + week IUP New onset IUGR with new onset SUA. Previously documented 3VC. Nl AFI and nl UAD.  Csection x 2 Gestational HTN- nl labs Marked Maternal anxiety  Will proceed with BMZ and dc in AM Gibran Veselka J 04/09/2019, 9:05 PM

## 2019-04-09 NOTE — MAU Note (Signed)
Was at MFM (she is small).  BP was elevated.  Sent over for further eval, possible Pre E. This was first time it was elevated. Has HA now, seeing spots.  Has been pretty dizzy all day. Denies increase in swelling., denies epigastric pain.

## 2019-04-10 ENCOUNTER — Other Ambulatory Visit (HOSPITAL_COMMUNITY): Payer: Self-pay | Admitting: *Deleted

## 2019-04-10 DIAGNOSIS — O365930 Maternal care for other known or suspected poor fetal growth, third trimester, not applicable or unspecified: Secondary | ICD-10-CM

## 2019-04-10 MED ORDER — LABETALOL HCL 100 MG PO TABS: 100.0000 mg | ORAL_TABLET | Freq: Three times a day (TID) | ORAL | 2 refills | Status: AC

## 2019-04-10 NOTE — Progress Notes (Signed)
Patient ID: Sally Edwards, female   DOB: [birth year], 33 y.o.   MRN: [id number] HD #1 [redacted]w[redacted]d  S: No complaints Good FM No headaches, chest pain or epigastric pain O: BP (!) 141/85 (BP Location: Left Arm) Comment: RN notified  Pulse 87   Temp 98.1 F (36.7 C) (Oral)   Resp 17   Ht 5\' 3"  (1.6 m)   Wt 88.5 kg   LMP 09/09/2018   SpO2 100%   BMI 34.58 kg/m   Lungs:CTA CV: RRR ABD: Gravid, NT No CVAT EXT: No cords. DTRs 2+ Neuro: nonfocal Skin: intact  FHR 140s, BTBV 5-25, No decels No contractions BPP 8/8 yesterday  A: 29w 3d IUP New onset IUGR with SUA Gestational HTN- no s/s PEC  P: DC home FU office for BMZ 2 Labetalol 100mg  tid Short term fu PEC precautions

## 2019-04-11 ENCOUNTER — Ambulatory Visit (HOSPITAL_COMMUNITY): Payer: No Typology Code available for payment source

## 2019-04-16 ENCOUNTER — Ambulatory Visit (HOSPITAL_COMMUNITY)
Admission: RE | Admit: 2019-04-16 | Discharge: 2019-04-16 | Disposition: A | Discharge status: Home / Self Care | Payer: No Typology Code available for payment source | Source: Ambulatory Visit | Attending: Obstetrics and Gynecology | Admitting: Obstetrics and Gynecology

## 2019-04-16 ENCOUNTER — Other Ambulatory Visit: Payer: Self-pay | Admitting: Obstetrics and Gynecology

## 2019-04-16 ENCOUNTER — Other Ambulatory Visit (HOSPITAL_COMMUNITY): Payer: Self-pay | Admitting: *Deleted

## 2019-04-16 ENCOUNTER — Other Ambulatory Visit: Payer: Self-pay

## 2019-04-16 ENCOUNTER — Encounter (HOSPITAL_COMMUNITY): Payer: Self-pay | Admitting: *Deleted

## 2019-04-16 ENCOUNTER — Ambulatory Visit (HOSPITAL_COMMUNITY): Payer: No Typology Code available for payment source | Admitting: *Deleted

## 2019-04-16 VITALS — BP 136/92 | HR 88 | Temp 98.4°F

## 2019-04-16 DIAGNOSIS — O365990 Maternal care for other known or suspected poor fetal growth, unspecified trimester, not applicable or unspecified: Secondary | ICD-10-CM

## 2019-04-16 DIAGNOSIS — O3429 Maternal care due to uterine scar from other previous surgery: Secondary | ICD-10-CM | POA: Diagnosis not present

## 2019-04-16 DIAGNOSIS — O139 Gestational [pregnancy-induced] hypertension without significant proteinuria, unspecified trimester: Secondary | ICD-10-CM | POA: Diagnosis not present

## 2019-04-16 DIAGNOSIS — Z3A30 30 weeks gestation of pregnancy: Secondary | ICD-10-CM

## 2019-04-16 DIAGNOSIS — O34219 Maternal care for unspecified type scar from previous cesarean delivery: Secondary | ICD-10-CM | POA: Diagnosis not present

## 2019-04-16 DIAGNOSIS — O365930 Maternal care for other known or suspected poor fetal growth, third trimester, not applicable or unspecified: Secondary | ICD-10-CM

## 2019-04-22 DIAGNOSIS — O139 Gestational [pregnancy-induced] hypertension without significant proteinuria, unspecified trimester: Secondary | ICD-10-CM | POA: Diagnosis not present

## 2019-04-23 ENCOUNTER — Ambulatory Visit (HOSPITAL_COMMUNITY): Payer: No Typology Code available for payment source

## 2019-04-25 ENCOUNTER — Ambulatory Visit (HOSPITAL_COMMUNITY)
Admission: RE | Admit: 2019-04-25 | Discharge: 2019-04-25 | Disposition: A | Discharge status: Home / Self Care | Payer: No Typology Code available for payment source | Source: Ambulatory Visit | Attending: Obstetrics and Gynecology | Admitting: Obstetrics and Gynecology

## 2019-04-25 ENCOUNTER — Encounter (HOSPITAL_COMMUNITY): Payer: Self-pay

## 2019-04-25 ENCOUNTER — Other Ambulatory Visit: Payer: Self-pay

## 2019-04-25 ENCOUNTER — Ambulatory Visit (HOSPITAL_COMMUNITY): Payer: No Typology Code available for payment source | Admitting: *Deleted

## 2019-04-25 VITALS — BP 124/89 | HR 95 | Temp 98.3°F

## 2019-04-25 DIAGNOSIS — O3429 Maternal care due to uterine scar from other previous surgery: Secondary | ICD-10-CM | POA: Diagnosis not present

## 2019-04-25 DIAGNOSIS — O34219 Maternal care for unspecified type scar from previous cesarean delivery: Secondary | ICD-10-CM

## 2019-04-25 DIAGNOSIS — O365930 Maternal care for other known or suspected poor fetal growth, third trimester, not applicable or unspecified: Secondary | ICD-10-CM | POA: Diagnosis not present

## 2019-04-25 DIAGNOSIS — Z3A31 31 weeks gestation of pregnancy: Secondary | ICD-10-CM | POA: Diagnosis not present

## 2019-04-25 DIAGNOSIS — O365990 Maternal care for other known or suspected poor fetal growth, unspecified trimester, not applicable or unspecified: Secondary | ICD-10-CM

## 2019-04-25 DIAGNOSIS — O133 Gestational [pregnancy-induced] hypertension without significant proteinuria, third trimester: Secondary | ICD-10-CM | POA: Diagnosis not present

## 2019-04-30 ENCOUNTER — Ambulatory Visit (HOSPITAL_COMMUNITY)
Admission: RE | Admit: 2019-04-30 | Discharge: 2019-04-30 | Disposition: A | Discharge status: Home / Self Care | Payer: No Typology Code available for payment source | Source: Ambulatory Visit | Attending: Obstetrics and Gynecology | Admitting: Obstetrics and Gynecology

## 2019-04-30 ENCOUNTER — Other Ambulatory Visit: Payer: Self-pay

## 2019-04-30 ENCOUNTER — Ambulatory Visit (HOSPITAL_COMMUNITY): Payer: No Typology Code available for payment source | Admitting: *Deleted

## 2019-04-30 ENCOUNTER — Encounter (HOSPITAL_COMMUNITY): Payer: Self-pay

## 2019-04-30 VITALS — BP 128/87 | HR 95 | Temp 98.0°F

## 2019-04-30 DIAGNOSIS — O365930 Maternal care for other known or suspected poor fetal growth, third trimester, not applicable or unspecified: Secondary | ICD-10-CM | POA: Insufficient documentation

## 2019-04-30 DIAGNOSIS — O3429 Maternal care due to uterine scar from other previous surgery: Secondary | ICD-10-CM

## 2019-04-30 DIAGNOSIS — Z362 Encounter for other antenatal screening follow-up: Secondary | ICD-10-CM | POA: Diagnosis not present

## 2019-04-30 DIAGNOSIS — O34219 Maternal care for unspecified type scar from previous cesarean delivery: Secondary | ICD-10-CM

## 2019-04-30 DIAGNOSIS — O365990 Maternal care for other known or suspected poor fetal growth, unspecified trimester, not applicable or unspecified: Secondary | ICD-10-CM | POA: Diagnosis not present

## 2019-04-30 DIAGNOSIS — Z3A32 32 weeks gestation of pregnancy: Secondary | ICD-10-CM

## 2019-04-30 DIAGNOSIS — O133 Gestational [pregnancy-induced] hypertension without significant proteinuria, third trimester: Secondary | ICD-10-CM | POA: Diagnosis not present

## 2019-04-30 NOTE — Procedures (Signed)
Sally Edwards 01-11-1986 [redacted]w[redacted]d  Fetus A Non-Stress Test Interpretation for 04/30/19  Indication: IUGR  Fetal Heart Rate A Mode: External Baseline Rate (A): 145 bpm Variability: Moderate Accelerations: 15 x 15 Decelerations: None Multiple birth?: No  Uterine Activity Mode: Palpation, Toco Contraction Frequency (min): None Resting Tone Palpated: Relaxed Resting Time: Adequate  Interpretation (Fetal Testing) Nonstress Test Interpretation: Reactive Comments: EFM tracing reviewed by Dr. Gertie Exon

## 2019-05-07 ENCOUNTER — Other Ambulatory Visit: Payer: Self-pay

## 2019-05-07 ENCOUNTER — Other Ambulatory Visit (HOSPITAL_COMMUNITY): Payer: Self-pay | Admitting: *Deleted

## 2019-05-07 ENCOUNTER — Ambulatory Visit (HOSPITAL_COMMUNITY): Payer: No Typology Code available for payment source | Admitting: *Deleted

## 2019-05-07 ENCOUNTER — Ambulatory Visit (HOSPITAL_COMMUNITY)
Admission: RE | Admit: 2019-05-07 | Discharge: 2019-05-07 | Disposition: A | Discharge status: Home / Self Care | Payer: No Typology Code available for payment source | Source: Ambulatory Visit | Attending: Obstetrics and Gynecology | Admitting: Obstetrics and Gynecology

## 2019-05-07 ENCOUNTER — Encounter (HOSPITAL_COMMUNITY): Payer: Self-pay

## 2019-05-07 VITALS — BP 132/78 | HR 96 | Temp 98.6°F

## 2019-05-07 DIAGNOSIS — O0990 Supervision of high risk pregnancy, unspecified, unspecified trimester: Secondary | ICD-10-CM | POA: Insufficient documentation

## 2019-05-07 DIAGNOSIS — O365990 Maternal care for other known or suspected poor fetal growth, unspecified trimester, not applicable or unspecified: Secondary | ICD-10-CM

## 2019-05-07 DIAGNOSIS — O34219 Maternal care for unspecified type scar from previous cesarean delivery: Secondary | ICD-10-CM | POA: Diagnosis not present

## 2019-05-07 DIAGNOSIS — Z3A33 33 weeks gestation of pregnancy: Secondary | ICD-10-CM

## 2019-05-07 DIAGNOSIS — O365930 Maternal care for other known or suspected poor fetal growth, third trimester, not applicable or unspecified: Secondary | ICD-10-CM

## 2019-05-07 DIAGNOSIS — O133 Gestational [pregnancy-induced] hypertension without significant proteinuria, third trimester: Secondary | ICD-10-CM | POA: Diagnosis not present

## 2019-05-07 DIAGNOSIS — O3429 Maternal care due to uterine scar from other previous surgery: Secondary | ICD-10-CM

## 2019-05-08 ENCOUNTER — Other Ambulatory Visit (HOSPITAL_COMMUNITY): Payer: Self-pay | Admitting: Obstetrics and Gynecology

## 2019-05-08 DIAGNOSIS — O365990 Maternal care for other known or suspected poor fetal growth, unspecified trimester, not applicable or unspecified: Secondary | ICD-10-CM

## 2019-05-09 DIAGNOSIS — O139 Gestational [pregnancy-induced] hypertension without significant proteinuria, unspecified trimester: Secondary | ICD-10-CM | POA: Diagnosis not present

## 2019-05-13 ENCOUNTER — Other Ambulatory Visit: Payer: Self-pay | Admitting: Obstetrics and Gynecology

## 2019-05-14 ENCOUNTER — Ambulatory Visit (HOSPITAL_COMMUNITY): Payer: No Typology Code available for payment source | Admitting: *Deleted

## 2019-05-14 ENCOUNTER — Encounter (HOSPITAL_COMMUNITY): Payer: Self-pay | Admitting: *Deleted

## 2019-05-14 ENCOUNTER — Ambulatory Visit (HOSPITAL_COMMUNITY)
Admission: RE | Admit: 2019-05-14 | Discharge: 2019-05-14 | Disposition: A | Discharge status: Home / Self Care | Payer: No Typology Code available for payment source | Source: Ambulatory Visit | Attending: Obstetrics and Gynecology | Admitting: Obstetrics and Gynecology

## 2019-05-14 ENCOUNTER — Other Ambulatory Visit: Payer: Self-pay

## 2019-05-14 VITALS — BP 135/89 | HR 97 | Temp 98.4°F

## 2019-05-14 DIAGNOSIS — O365930 Maternal care for other known or suspected poor fetal growth, third trimester, not applicable or unspecified: Secondary | ICD-10-CM | POA: Diagnosis not present

## 2019-05-14 DIAGNOSIS — O365990 Maternal care for other known or suspected poor fetal growth, unspecified trimester, not applicable or unspecified: Secondary | ICD-10-CM

## 2019-05-14 DIAGNOSIS — O3429 Maternal care due to uterine scar from other previous surgery: Secondary | ICD-10-CM | POA: Diagnosis not present

## 2019-05-14 DIAGNOSIS — O133 Gestational [pregnancy-induced] hypertension without significant proteinuria, third trimester: Secondary | ICD-10-CM

## 2019-05-14 DIAGNOSIS — O34219 Maternal care for unspecified type scar from previous cesarean delivery: Secondary | ICD-10-CM | POA: Diagnosis not present

## 2019-05-14 DIAGNOSIS — Z3A34 34 weeks gestation of pregnancy: Secondary | ICD-10-CM | POA: Diagnosis not present

## 2019-05-15 ENCOUNTER — Telehealth (HOSPITAL_COMMUNITY): Payer: Self-pay | Admitting: *Deleted

## 2019-05-15 NOTE — Patient Instructions (Signed)
Sally Edwards  05/15/2019   Your procedure is scheduled on:  05/30/2019  Arrive at [place] at Entrance C on [address] [facility]  and [address]. You are invited to use the FREE valet parking or use the Visitor's parking deck.  Pick up the phone at the desk and dial [phone].  Call this number if you have problems the morning of surgery: [phone]  Remember:   Do not eat food:(After Midnight) Desps de medianoche.  Do not drink clear liquids: (After Midnight) Desps de medianoche.  Take these medicines the morning of surgery with A SIP OF WATER:  Take zoloft and labetalol as directed   Do not wear jewelry, make-up or nail polish.  Do not wear lotions, powders, or perfumes. Do not wear deodorant.  Do not shave 48 hours prior to surgery.  Do not bring valuables to the hospital.  [facility] is not   responsible for any belongings or valuables brought to the hospital.  Contacts, dentures or bridgework may not be worn into surgery.  Leave suitcase in the car. After surgery it may be brought to your room.  For patients admitted to the hospital, checkout time is 11:00 AM the day of              discharge.      Please read over the following fact sheets that you were given:     Preparing for Surgery

## 2019-05-15 NOTE — Telephone Encounter (Signed)
Preadmission screen  

## 2019-05-16 ENCOUNTER — Encounter (HOSPITAL_COMMUNITY): Payer: Self-pay

## 2019-05-17 DIAGNOSIS — O139 Gestational [pregnancy-induced] hypertension without significant proteinuria, unspecified trimester: Secondary | ICD-10-CM | POA: Diagnosis not present

## 2019-05-21 ENCOUNTER — Encounter (HOSPITAL_COMMUNITY): Payer: Self-pay

## 2019-05-21 ENCOUNTER — Encounter (HOSPITAL_COMMUNITY): Payer: Self-pay | Admitting: Anesthesiology

## 2019-05-21 ENCOUNTER — Other Ambulatory Visit: Payer: Self-pay

## 2019-05-21 ENCOUNTER — Inpatient Hospital Stay (HOSPITAL_COMMUNITY): Payer: No Typology Code available for payment source | Admitting: Anesthesiology

## 2019-05-21 ENCOUNTER — Ambulatory Visit (HOSPITAL_COMMUNITY): Payer: No Typology Code available for payment source | Admitting: *Deleted

## 2019-05-21 ENCOUNTER — Ambulatory Visit (HOSPITAL_COMMUNITY)
Admission: RE | Admit: 2019-05-21 | Discharge: 2019-05-21 | Disposition: A | Discharge status: Home / Self Care | Payer: No Typology Code available for payment source | Source: Ambulatory Visit | Attending: Obstetrics and Gynecology | Admitting: Obstetrics and Gynecology

## 2019-05-21 ENCOUNTER — Encounter (HOSPITAL_COMMUNITY)
Admission: AD | Disposition: A | Discharge status: Home / Self Care | Payer: Self-pay | Source: Home / Self Care | Attending: Obstetrics and Gynecology

## 2019-05-21 ENCOUNTER — Inpatient Hospital Stay (HOSPITAL_COMMUNITY)
Admission: AD | Admit: 2019-05-21 | Discharge: 2019-05-25 | DRG: 787 | Disposition: A | Discharge status: Home / Self Care | Payer: No Typology Code available for payment source | Attending: Obstetrics and Gynecology | Admitting: Obstetrics and Gynecology

## 2019-05-21 VITALS — BP 141/94 | HR 101 | Temp 98.2°F

## 2019-05-21 DIAGNOSIS — O365930 Maternal care for other known or suspected poor fetal growth, third trimester, not applicable or unspecified: Secondary | ICD-10-CM | POA: Diagnosis not present

## 2019-05-21 DIAGNOSIS — Z362 Encounter for other antenatal screening follow-up: Secondary | ICD-10-CM | POA: Diagnosis not present

## 2019-05-21 DIAGNOSIS — Z20828 Contact with and (suspected) exposure to other viral communicable diseases: Secondary | ICD-10-CM | POA: Diagnosis present

## 2019-05-21 DIAGNOSIS — O133 Gestational [pregnancy-induced] hypertension without significant proteinuria, third trimester: Secondary | ICD-10-CM

## 2019-05-21 DIAGNOSIS — O34219 Maternal care for unspecified type scar from previous cesarean delivery: Secondary | ICD-10-CM

## 2019-05-21 DIAGNOSIS — O134 Gestational [pregnancy-induced] hypertension without significant proteinuria, complicating childbirth: Secondary | ICD-10-CM | POA: Diagnosis not present

## 2019-05-21 DIAGNOSIS — O99344 Other mental disorders complicating childbirth: Secondary | ICD-10-CM | POA: Diagnosis present

## 2019-05-21 DIAGNOSIS — O1414 Severe pre-eclampsia complicating childbirth: Principal | ICD-10-CM | POA: Diagnosis present

## 2019-05-21 DIAGNOSIS — Z3A35 35 weeks gestation of pregnancy: Secondary | ICD-10-CM

## 2019-05-21 DIAGNOSIS — D62 Acute posthemorrhagic anemia: Secondary | ICD-10-CM | POA: Diagnosis not present

## 2019-05-21 DIAGNOSIS — O1490 Unspecified pre-eclampsia, unspecified trimester: Secondary | ICD-10-CM | POA: Diagnosis present

## 2019-05-21 DIAGNOSIS — O9081 Anemia of the puerperium: Secondary | ICD-10-CM | POA: Diagnosis not present

## 2019-05-21 DIAGNOSIS — O365990 Maternal care for other known or suspected poor fetal growth, unspecified trimester, not applicable or unspecified: Secondary | ICD-10-CM

## 2019-05-21 DIAGNOSIS — F329 Major depressive disorder, single episode, unspecified: Secondary | ICD-10-CM | POA: Diagnosis present

## 2019-05-21 DIAGNOSIS — Z3A00 Weeks of gestation of pregnancy not specified: Secondary | ICD-10-CM | POA: Diagnosis not present

## 2019-05-21 DIAGNOSIS — O3429 Maternal care due to uterine scar from other previous surgery: Secondary | ICD-10-CM

## 2019-05-21 DIAGNOSIS — O1410 Severe pre-eclampsia, unspecified trimester: Secondary | ICD-10-CM | POA: Diagnosis present

## 2019-05-21 LAB — CBC WITH DIFFERENTIAL/PLATELET
Abs Immature Granulocytes: 0.09 K/uL — ABNORMAL HIGH (ref 0.00–0.07)
Basophils Absolute: 0 K/uL (ref 0.0–0.1)
Basophils Relative: 0 %
Eosinophils Absolute: 0.2 K/uL (ref 0.0–0.5)
Eosinophils Relative: 2 %
HCT: 40.5 % (ref 36.0–46.0)
Hemoglobin: 14 g/dL (ref 12.0–15.0)
Immature Granulocytes: 1 %
Lymphocytes Relative: 29 %
Lymphs Abs: 2.5 K/uL (ref 0.7–4.0)
MCH: 29.7 pg (ref 26.0–34.0)
MCHC: 34.6 g/dL (ref 30.0–36.0)
MCV: 86 fL (ref 80.0–100.0)
Monocytes Absolute: 0.6 K/uL (ref 0.1–1.0)
Monocytes Relative: 6 %
Neutro Abs: 5.4 K/uL (ref 1.7–7.7)
Neutrophils Relative %: 62 %
Platelets: 178 K/uL (ref 150–400)
RBC: 4.71 MIL/uL (ref 3.87–5.11)
RDW: 13.2 % (ref 11.5–15.5)
WBC: 8.7 K/uL (ref 4.0–10.5)
nRBC: 0 % (ref 0.0–0.2)

## 2019-05-21 LAB — COMPREHENSIVE METABOLIC PANEL WITH GFR
ALT: 74 U/L — ABNORMAL HIGH (ref 0–44)
AST: 61 U/L — ABNORMAL HIGH (ref 15–41)
Albumin: 2.8 g/dL — ABNORMAL LOW (ref 3.5–5.0)
Alkaline Phosphatase: 135 U/L — ABNORMAL HIGH (ref 38–126)
Anion gap: 11 (ref 5–15)
BUN: 5 mg/dL — ABNORMAL LOW (ref 6–20)
CO2: 21 mmol/L — ABNORMAL LOW (ref 22–32)
Calcium: 8.9 mg/dL (ref 8.9–10.3)
Chloride: 104 mmol/L (ref 98–111)
Creatinine, Ser: 0.38 mg/dL — ABNORMAL LOW (ref 0.44–1.00)
GFR calc Af Amer: >60 mL/min
GFR calc non Af Amer: >60 mL/min
Glucose, Bld: 70 mg/dL (ref 70–99)
Potassium: 3.6 mmol/L (ref 3.5–5.1)
Sodium: 136 mmol/L (ref 135–145)
Total Bilirubin: 0.7 mg/dL (ref 0.3–1.2)
Total Protein: 6.2 g/dL — ABNORMAL LOW (ref 6.5–8.1)

## 2019-05-21 LAB — SARS CORONAVIRUS 2 BY RT PCR (HOSPITAL ORDER, PERFORMED IN ~~LOC~~ HOSPITAL LAB): SARS Coronavirus 2: NEGATIVE

## 2019-05-21 LAB — PREPARE RBC (CROSSMATCH)

## 2019-05-21 SURGERY — Surgical Case
Anesthesia: Spinal

## 2019-05-21 MED ORDER — WITCH HAZEL-GLYCERIN EX PADS: 1.0000 "application " | MEDICATED_PAD | CUTANEOUS | Status: DC | PRN

## 2019-05-21 MED ORDER — FENTANYL CITRATE (PF) 100 MCG/2ML IJ SOLN
INTRAMUSCULAR | Status: DC | PRN
  Administered 2019-05-21: 15 ug via INTRATHECAL

## 2019-05-21 MED ORDER — KETOROLAC TROMETHAMINE 30 MG/ML IJ SOLN: 30.0000 mg | Freq: Four times a day (QID) | INTRAMUSCULAR | Status: DC | PRN

## 2019-05-21 MED ORDER — COCONUT OIL OIL: 1.0000 "application " | TOPICAL_OIL | Status: DC | PRN

## 2019-05-21 MED ORDER — MORPHINE SULFATE (PF) 0.5 MG/ML IJ SOLN
INTRAMUSCULAR | Status: AC
  Filled 2019-05-21: qty 10

## 2019-05-21 MED ORDER — PHENYLEPHRINE HCL-NACL 20-0.9 MG/250ML-% IV SOLN
INTRAVENOUS | Status: AC
  Filled 2019-05-21: qty 250

## 2019-05-21 MED ORDER — KETOROLAC TROMETHAMINE 30 MG/ML IJ SOLN
30.0000 mg | Freq: Four times a day (QID) | INTRAMUSCULAR | Status: DC | PRN
  Administered 2019-05-21: 30 mg via INTRAMUSCULAR

## 2019-05-21 MED ORDER — PHENYLEPHRINE 40 MCG/ML (10ML) SYRINGE FOR IV PUSH (FOR BLOOD PRESSURE SUPPORT)
PREFILLED_SYRINGE | INTRAVENOUS | Status: AC
  Filled 2019-05-21: qty 10

## 2019-05-21 MED ORDER — BUPIVACAINE HCL (PF) 0.25 % IJ SOLN
INTRAMUSCULAR | Status: DC | PRN
  Administered 2019-05-21: 20 mL

## 2019-05-21 MED ORDER — KETOROLAC TROMETHAMINE 30 MG/ML IJ SOLN: 30.0000 mg | Freq: Four times a day (QID) | INTRAMUSCULAR | Status: DC

## 2019-05-21 MED ORDER — SERTRALINE HCL 50 MG PO TABS
50.0000 mg | ORAL_TABLET | Freq: Every day | ORAL | Status: DC
  Administered 2019-05-21 – 2019-05-25 (×5): 50 mg via ORAL
  Filled 2019-05-21 (×5): qty 1

## 2019-05-21 MED ORDER — ZOLPIDEM TARTRATE 5 MG PO TABS: 5.0000 mg | ORAL_TABLET | Freq: Every evening | ORAL | Status: DC | PRN

## 2019-05-21 MED ORDER — PRENATAL MULTIVITAMIN CH
1.0000 | ORAL_TABLET | Freq: Every day | ORAL | Status: DC
  Administered 2019-05-22 – 2019-05-25 (×4): 1 via ORAL
  Filled 2019-05-21 (×4): qty 1

## 2019-05-21 MED ORDER — MORPHINE SULFATE (PF) 0.5 MG/ML IJ SOLN
INTRAMUSCULAR | Status: DC | PRN
  Administered 2019-05-21: .15 mg via INTRATHECAL

## 2019-05-21 MED ORDER — SODIUM CHLORIDE 0.9 % IR SOLN
Status: DC | PRN
  Administered 2019-05-21: 1000 mL

## 2019-05-21 MED ORDER — NALOXONE HCL 0.4 MG/ML IJ SOLN: 0.4000 mg | INTRAMUSCULAR | Status: DC | PRN

## 2019-05-21 MED ORDER — CEFAZOLIN SODIUM-DEXTROSE 2-3 GM-%(50ML) IV SOLR
INTRAVENOUS | Status: DC | PRN
  Administered 2019-05-21: 2 g via INTRAVENOUS

## 2019-05-21 MED ORDER — LACTATED RINGERS IV SOLN
INTRAVENOUS | Status: DC | PRN
  Administered 2019-05-21 (×2): via INTRAVENOUS

## 2019-05-21 MED ORDER — LACTATED RINGERS IV SOLN
INTRAVENOUS | Status: DC
  Administered 2019-05-22 (×2): via INTRAVENOUS

## 2019-05-21 MED ORDER — NALBUPHINE HCL 10 MG/ML IJ SOLN: 5.0000 mg | INTRAMUSCULAR | Status: DC | PRN

## 2019-05-21 MED ORDER — SIMETHICONE 80 MG PO CHEW
80.0000 mg | CHEWABLE_TABLET | ORAL | Status: DC
  Administered 2019-05-21 – 2019-05-25 (×4): 80 mg via ORAL
  Filled 2019-05-21 (×4): qty 1

## 2019-05-21 MED ORDER — SIMETHICONE 80 MG PO CHEW
80.0000 mg | CHEWABLE_TABLET | Freq: Three times a day (TID) | ORAL | Status: DC
  Administered 2019-05-22 – 2019-05-25 (×8): 80 mg via ORAL
  Filled 2019-05-21 (×8): qty 1

## 2019-05-21 MED ORDER — KETOROLAC TROMETHAMINE 30 MG/ML IJ SOLN
INTRAMUSCULAR | Status: AC
  Filled 2019-05-21: qty 1

## 2019-05-21 MED ORDER — OXYCODONE HCL 5 MG PO TABS
5.0000 mg | ORAL_TABLET | Freq: Once | ORAL | Status: AC
  Administered 2019-05-21: 5 mg via ORAL
  Filled 2019-05-21: qty 1

## 2019-05-21 MED ORDER — DIPHENHYDRAMINE HCL 25 MG PO CAPS: 25.0000 mg | ORAL_CAPSULE | ORAL | Status: DC | PRN

## 2019-05-21 MED ORDER — NALBUPHINE HCL 10 MG/ML IJ SOLN: 5.0000 mg | Freq: Once | INTRAMUSCULAR | Status: DC | PRN

## 2019-05-21 MED ORDER — SENNOSIDES-DOCUSATE SODIUM 8.6-50 MG PO TABS
2.0000 | ORAL_TABLET | ORAL | Status: DC
  Administered 2019-05-21 – 2019-05-25 (×4): 2 via ORAL
  Filled 2019-05-21 (×4): qty 2

## 2019-05-21 MED ORDER — OXYCODONE-ACETAMINOPHEN 5-325 MG PO TABS
1.0000 | ORAL_TABLET | ORAL | Status: DC | PRN
  Administered 2019-05-22 – 2019-05-25 (×15): 1 via ORAL
  Filled 2019-05-21: qty 2
  Filled 2019-05-21 (×3): qty 1
  Filled 2019-05-21: qty 2
  Filled 2019-05-21 (×10): qty 1

## 2019-05-21 MED ORDER — IBUPROFEN 800 MG PO TABS: 800.0000 mg | ORAL_TABLET | Freq: Four times a day (QID) | ORAL | Status: DC

## 2019-05-21 MED ORDER — ONDANSETRON HCL 4 MG/2ML IJ SOLN
INTRAMUSCULAR | Status: AC
  Filled 2019-05-21: qty 2

## 2019-05-21 MED ORDER — SOD CITRATE-CITRIC ACID 500-334 MG/5ML PO SOLN
30.0000 mL | Freq: Once | ORAL | Status: AC
  Administered 2019-05-21: 18:00:00 30 mL via ORAL

## 2019-05-21 MED ORDER — NALOXONE HCL 4 MG/10ML IJ SOLN
1.0000 ug/kg/h | INTRAVENOUS | Status: DC | PRN
  Filled 2019-05-21: qty 5

## 2019-05-21 MED ORDER — MEPERIDINE HCL 25 MG/ML IJ SOLN: 6.2500 mg | INTRAMUSCULAR | Status: DC | PRN

## 2019-05-21 MED ORDER — SODIUM CHLORIDE 0.9 % IV SOLN
INTRAVENOUS | Status: DC | PRN
  Administered 2019-05-21: 19:00:00 via INTRAVENOUS

## 2019-05-21 MED ORDER — METHYLERGONOVINE MALEATE 0.2 MG PO TABS: 0.2000 mg | ORAL_TABLET | ORAL | Status: DC | PRN

## 2019-05-21 MED ORDER — METHYLERGONOVINE MALEATE 0.2 MG/ML IJ SOLN: 0.2000 mg | INTRAMUSCULAR | Status: DC | PRN

## 2019-05-21 MED ORDER — ACETAMINOPHEN 500 MG PO TABS
1000.0000 mg | ORAL_TABLET | Freq: Once | ORAL | Status: AC
  Administered 2019-05-21: 18:00:00 1000 mg via ORAL
  Filled 2019-05-21: qty 2

## 2019-05-21 MED ORDER — DEXAMETHASONE SODIUM PHOSPHATE 10 MG/ML IJ SOLN
INTRAMUSCULAR | Status: AC
  Filled 2019-05-21: qty 1

## 2019-05-21 MED ORDER — FENTANYL CITRATE (PF) 100 MCG/2ML IJ SOLN
INTRAMUSCULAR | Status: AC
  Filled 2019-05-21: qty 2

## 2019-05-21 MED ORDER — FAMOTIDINE 20 MG PO TABS
20.0000 mg | ORAL_TABLET | Freq: Once | ORAL | Status: AC
  Administered 2019-05-21: 20 mg via ORAL
  Filled 2019-05-21: qty 1

## 2019-05-21 MED ORDER — IBUPROFEN 800 MG PO TABS
800.0000 mg | ORAL_TABLET | Freq: Four times a day (QID) | ORAL | Status: DC
  Administered 2019-05-23 – 2019-05-25 (×9): 800 mg via ORAL
  Filled 2019-05-21 (×9): qty 1

## 2019-05-21 MED ORDER — DIPHENHYDRAMINE HCL 25 MG PO CAPS
25.0000 mg | ORAL_CAPSULE | Freq: Four times a day (QID) | ORAL | Status: DC | PRN
  Administered 2019-05-22: 25 mg via ORAL
  Filled 2019-05-21: qty 1

## 2019-05-21 MED ORDER — ONDANSETRON HCL 4 MG/2ML IJ SOLN
INTRAMUSCULAR | Status: DC | PRN
  Administered 2019-05-21: 4 mg via INTRAVENOUS

## 2019-05-21 MED ORDER — SOD CITRATE-CITRIC ACID 500-334 MG/5ML PO SOLN
ORAL | Status: AC
  Filled 2019-05-21: qty 30

## 2019-05-21 MED ORDER — DIPHENHYDRAMINE HCL 50 MG/ML IJ SOLN: 12.5000 mg | INTRAMUSCULAR | Status: DC | PRN

## 2019-05-21 MED ORDER — DEXAMETHASONE SODIUM PHOSPHATE 10 MG/ML IJ SOLN
INTRAMUSCULAR | Status: DC | PRN
  Administered 2019-05-21: 10 mg via INTRAVENOUS

## 2019-05-21 MED ORDER — DIBUCAINE (PERIANAL) 1 % EX OINT: 1.0000 "application " | TOPICAL_OINTMENT | CUTANEOUS | Status: DC | PRN

## 2019-05-21 MED ORDER — KETOROLAC TROMETHAMINE 30 MG/ML IJ SOLN
30.0000 mg | Freq: Four times a day (QID) | INTRAMUSCULAR | Status: AC
  Administered 2019-05-22 (×4): 30 mg via INTRAVENOUS
  Filled 2019-05-21 (×4): qty 1

## 2019-05-21 MED ORDER — SODIUM CHLORIDE 0.9 % IV SOLN
INTRAVENOUS | Status: DC | PRN
  Administered 2019-05-21: 19:00:00 40 [IU] via INTRAVENOUS

## 2019-05-21 MED ORDER — MAGNESIUM SULFATE BOLUS VIA INFUSION
4.0000 g | Freq: Once | INTRAVENOUS | Status: AC
  Administered 2019-05-21: 21:00:00 4 g via INTRAVENOUS
  Filled 2019-05-21: qty 500

## 2019-05-21 MED ORDER — SIMETHICONE 80 MG PO CHEW
80.0000 mg | CHEWABLE_TABLET | ORAL | Status: DC | PRN
  Administered 2019-05-22: 80 mg via ORAL
  Filled 2019-05-21: qty 1

## 2019-05-21 MED ORDER — SODIUM CHLORIDE 0.9 % IV SOLN
INTRAVENOUS | Status: DC | PRN
  Administered 2019-05-21: 19:00:00 30 ug/min via INTRAVENOUS

## 2019-05-21 MED ORDER — DEXTROSE IN LACTATED RINGERS 5 % IV SOLN
INTRAVENOUS | Status: DC
  Administered 2019-05-21: 15:00:00 via INTRAVENOUS

## 2019-05-21 MED ORDER — MAGNESIUM SULFATE 40 G IN LACTATED RINGERS - SIMPLE
2.0000 g/h | INTRAVENOUS | Status: DC
  Administered 2019-05-22: 2 g/h via INTRAVENOUS

## 2019-05-21 MED ORDER — MAGNESIUM SULFATE 40 G IN LACTATED RINGERS - SIMPLE
INTRAVENOUS | Status: AC
  Filled 2019-05-21: qty 500

## 2019-05-21 MED ORDER — ONDANSETRON HCL 4 MG/2ML IJ SOLN: 4.0000 mg | Freq: Three times a day (TID) | INTRAMUSCULAR | Status: DC | PRN

## 2019-05-21 MED ORDER — BUPIVACAINE IN DEXTROSE 0.75-8.25 % IT SOLN
INTRATHECAL | Status: DC | PRN
  Administered 2019-05-21: 1.6 mL via INTRATHECAL

## 2019-05-21 MED ORDER — SCOPOLAMINE 1 MG/3DAYS TD PT72
1.0000 | MEDICATED_PATCH | Freq: Once | TRANSDERMAL | Status: AC
  Administered 2019-05-21: 1.5 mg via TRANSDERMAL
  Filled 2019-05-21: qty 1

## 2019-05-21 MED ORDER — TETANUS-DIPHTH-ACELL PERTUSSIS 5-2.5-18.5 LF-MCG/0.5 IM SUSP: 0.5000 mL | Freq: Once | INTRAMUSCULAR | Status: DC

## 2019-05-21 MED ORDER — BUPIVACAINE HCL (PF) 0.25 % IJ SOLN
INTRAMUSCULAR | Status: AC
  Filled 2019-05-21: qty 30

## 2019-05-21 MED ORDER — OXYTOCIN 40 UNITS IN NORMAL SALINE INFUSION - SIMPLE MED
2.5000 [IU]/h | INTRAVENOUS | Status: AC
  Administered 2019-05-21: 2.5 [IU]/h via INTRAVENOUS

## 2019-05-21 MED ORDER — MENTHOL 3 MG MT LOZG
1.0000 | LOZENGE | OROMUCOSAL | Status: DC | PRN
  Administered 2019-05-24: 06:00:00 3 mg via ORAL
  Filled 2019-05-21: qty 9

## 2019-05-21 MED ORDER — SODIUM CHLORIDE 0.9% FLUSH: 3.0000 mL | INTRAVENOUS | Status: DC | PRN

## 2019-05-21 MED ORDER — CEFAZOLIN SODIUM-DEXTROSE 2-4 GM/100ML-% IV SOLN
INTRAVENOUS | Status: AC
  Filled 2019-05-21: qty 100

## 2019-05-21 SURGICAL SUPPLY — 38 items
BENZOIN TINCTURE PRP APPL 2/3 (GAUZE/BANDAGES/DRESSINGS) ×3 IMPLANT
CLAMP CORD UMBIL (MISCELLANEOUS) IMPLANT
CLOSURE WOUND 1/2 X4 (GAUZE/BANDAGES/DRESSINGS) ×1
CLOTH BEACON ORANGE TIMEOUT ST (SAFETY) ×3 IMPLANT
DECANTER SPIKE VIAL GLASS SM (MISCELLANEOUS) ×3 IMPLANT
DRSG OPSITE POSTOP 4X10 (GAUZE/BANDAGES/DRESSINGS) ×3 IMPLANT
ELECT REM PT RETURN 9FT ADLT (ELECTROSURGICAL) ×3
ELECTRODE REM PT RTRN 9FT ADLT (ELECTROSURGICAL) ×1 IMPLANT
EXTRACTOR VACUUM M CUP 4 TUBE (SUCTIONS) IMPLANT
EXTRACTOR VACUUM M CUP 4' TUBE (SUCTIONS)
GLOVE BIO SURGEON STRL SZ7.5 (GLOVE) ×3 IMPLANT
GLOVE BIOGEL PI IND STRL 7.0 (GLOVE) ×7 IMPLANT
GLOVE BIOGEL PI INDICATOR 7.0 (GLOVE) ×14
GLOVE ECLIPSE 6.5 STRL STRAW (GLOVE) ×6 IMPLANT
GOWN STRL REUS W/TWL LRG LVL3 (GOWN DISPOSABLE) ×6 IMPLANT
HEMOSTAT SURGICEL 2X3 (HEMOSTASIS) ×3 IMPLANT
KIT ABG SYR 3ML LUER SLIP (SYRINGE) IMPLANT
NEEDLE HYPO 22GX1.5 SAFETY (NEEDLE) ×3 IMPLANT
NEEDLE HYPO 25X5/8 SAFETYGLIDE (NEEDLE) IMPLANT
NEEDLE SPNL 20GX3.5 QUINCKE YW (NEEDLE) IMPLANT
NS IRRIG 1000ML POUR BTL (IV SOLUTION) ×3 IMPLANT
PACK C SECTION WH (CUSTOM PROCEDURE TRAY) ×3 IMPLANT
PAD OB MATERNITY 4.3X12.25 (PERSONAL CARE ITEMS) ×3 IMPLANT
STRIP CLOSURE SKIN 1/2X4 (GAUZE/BANDAGES/DRESSINGS) ×2 IMPLANT
SUT MNCRL 0 VIOLET CTX 36 (SUTURE) ×2 IMPLANT
SUT MNCRL AB 3-0 PS2 27 (SUTURE) ×3 IMPLANT
SUT MON AB 2-0 CT1 27 (SUTURE) ×3 IMPLANT
SUT MON AB-0 CT1 36 (SUTURE) ×6 IMPLANT
SUT MONOCRYL 0 CTX 36 (SUTURE) ×4
SUT PLAIN 0 NONE (SUTURE) IMPLANT
SUT PLAIN 2 0 (SUTURE)
SUT PLAIN 2 0 XLH (SUTURE) ×3 IMPLANT
SUT PLAIN ABS 2-0 CT1 27XMFL (SUTURE) IMPLANT
SYR 20CC LL (SYRINGE) ×3 IMPLANT
SYR CONTROL 10ML LL (SYRINGE) ×3 IMPLANT
TOWEL OR 17X24 6PK STRL BLUE (TOWEL DISPOSABLE) ×3 IMPLANT
TRAY FOLEY W/BAG SLVR 14FR LF (SET/KITS/TRAYS/PACK) ×3 IMPLANT
WATER STERILE IRR 1000ML POUR (IV SOLUTION) ×3 IMPLANT

## 2019-05-21 NOTE — MAU Note (Signed)
Covid swab collected. PT tolerated well. PT asymptomatic 

## 2019-05-21 NOTE — Anesthesia Preprocedure Evaluation (Addendum)
Anesthesia Evaluation  Patient identified by MRN, date of birth, ID band Patient awake    Reviewed: Allergy & Precautions, NPO status , Patient's Chart, lab work & pertinent test results  Airway Mallampati: II  TM Distance: >3 FB     Dental   Pulmonary neg pulmonary ROS,    breath sounds clear to auscultation       Cardiovascular hypertension (Pre-E), Pt. on medications and Pt. on home beta blockers  Rhythm:Regular Rate:Normal     Neuro/Psych Anxiety negative neurological ROS     GI/Hepatic negative GI ROS, Elevated LFT's   Endo/Other  negative endocrine ROS  Renal/GU negative Renal ROS     Musculoskeletal negative musculoskeletal ROS (+)   Abdominal   Peds  Hematology negative hematology ROS (+)   Anesthesia Other Findings Pre E  Reproductive/Obstetrics (+) Pregnancy                            Lab Results  Component Value Date   WBC 8.7 05/21/2019   HGB 14.0 05/21/2019   HCT 40.5 05/21/2019   MCV 86.0 05/21/2019   PLT 178 05/21/2019   Lab Results  Component Value Date   CREATININE 0.38 (L) 05/21/2019   BUN 5 (L) 05/21/2019   NA 136 05/21/2019   K 3.6 05/21/2019   CL 104 05/21/2019   CO2 21 (L) 05/21/2019    Anesthesia Physical Anesthesia Plan  ASA: III  Anesthesia Plan: Spinal   Post-op Pain Management:    Induction:   PONV Risk Score and Plan: 2 and Dexamethasone, Ondansetron and Treatment may vary due to age or medical condition  Airway Management Planned: Natural Airway  Additional Equipment:   Intra-op Plan:   Post-operative Plan:   Informed Consent: I have reviewed the patients History and Physical, chart, labs and discussed the procedure including the risks, benefits and alternatives for the proposed anesthesia with the patient or authorized representative who has indicated his/her understanding and acceptance.       Plan Discussed with:  CRNA  Anesthesia Plan Comments:         Anesthesia Quick Evaluation

## 2019-05-21 NOTE — H&P (Signed)
Sally Edwards is a 33 y.o. female presenting for admission for gest htn, severe iugr and now new onset epigastric pain.. OB History    Gravida  3   Para  2   Term  2   Preterm      AB      Living  2     SAB      TAB      Ectopic      Multiple  0   Live Births  2          Past Medical History:  Diagnosis Date  . Anxiety   . Complication of anesthesia 2010   "passed out" post epidural for myomectomy  . Fibroid 2010   Past Surgical History:  Procedure Laterality Date  . CESAREAN SECTION N/A 11/11/2013   Procedure: Primary CESAREAN SECTION;  Surgeon: Lovenia Kim, MD;  Location: [facility];  Service: Obstetrics;  Laterality: N/A;  EDD: 11/29/13  . CESAREAN SECTION N/A 02/09/2015   Procedure: CESAREAN SECTION REPEAT ;  Surgeon: Brien Few, MD;  Location: [facility];  Service: Obstetrics;  Laterality: N/A;  EDD: 02/27/15 Gearldine Bienenstock. RNFA confirmed with Dr. Ronita Hipps  . [place]  2010  . torticollis     Family History: family history includes Heart disease in her maternal grandfather, maternal grandmother, paternal grandfather, and paternal grandmother; Hypertension in her mother. Social History:  reports that she has never smoked. She has never used smokeless tobacco. She reports current alcohol use. She reports that she does not use drugs.     Maternal Diabetes: No Genetic Screening: Normal Maternal Ultrasounds/Referrals: IUGR Fetal Ultrasounds or other Referrals:  Referred to Materal Fetal Medicine  Maternal Substance Abuse:  No Significant Maternal Medications:  None Significant Maternal Lab Results:  None Other Comments:  None  Review of Systems  Constitutional: Negative.   Gastrointestinal: Positive for heartburn.  Skin: Positive for itching.   History   Blood pressure (!) 134/91, pulse 96, temperature 98 F (36.7 C), temperature source Oral, resp. rate 18, height 5\' 3"  (1.6 m), weight 90.3 kg, last menstrual period 09/09/2018, SpO2  99 %, unknown if currently breastfeeding. Exam Physical Exam  Prenatal labs: ABO, Rh: --/--/B POS (09/01 1444) Antibody: NEG (09/01 1444) Rubella: Immune (02/26 0000) RPR: Nonreactive (02/26 0000)  HBsAg: Negative (02/26 0000)  HIV: Non-reactive (02/26 0000)  GBS:     Assessment/Plan: Severe IUGR Epigastric pain ? Severe PEC Admit   Emalea Mix J 05/21/2019, 4:40 PM

## 2019-05-21 NOTE — Transfer of Care (Signed)
Immediate Anesthesia Transfer of Care Note  Patient: Sally Edwards  Procedure(s) Performed: REPEAT CESAREAN SECTION (N/A )  Patient Location: PACU  Anesthesia Type:Spinal  Level of Consciousness: awake, alert  and oriented  Airway & Oxygen Therapy: Patient Spontanous Breathing  Post-op Assessment: Report given to RN and Post -op Vital signs reviewed and stable  Post vital signs: Reviewed and stable  Last Vitals:  Vitals Value Taken Time  BP    Temp    Pulse    Resp    SpO2      Last Pain:  Vitals:   05/21/19 1806  TempSrc: Oral  PainSc:       Patients Stated Pain Goal: 6 ([id number])  Complications: No apparent anesthesia complications

## 2019-05-21 NOTE — Op Note (Signed)
Cesarean Section Procedure Note  Indications: previous myomectomy and previous uterine incision kerr x2  Pre-operative Diagnosis: 35 week 2 day pregnancy.  Post-operative Diagnosis: same  Surgeon: Lovenia Kim   Assistants: Janann Colonel, CNM  Anesthesia: Local anesthesia 0.25.% bupivacaine and Spinal anesthesia  ASA Class: 2  Procedure Details  The patient was seen in the Holding Room. The risks, benefits, complications, treatment options, and expected outcomes were discussed with the patient.  The patient concurred with the proposed plan, giving informed consent. The risks of anesthesia, infection, bleeding and possible injury to other organs discussed. Injury to bowel, bladder, or ureter with possible need for repair discussed. Possible need for transfusion with secondary risks of hepatitis or HIV acquisition discussed. Post operative complications to include but not limited to DVT, PE and Pneumonia noted. The site of surgery properly noted/marked. The patient was taken to Operating Room # B, identified as TAJHA MCCLURKIN and the procedure verified as C-Section Delivery. A Time Out was held and the above information confirmed.  After induction of anesthesia, the patient was draped and prepped in the usual sterile manner. A Pfannenstiel incision was made and carried down through the subcutaneous tissue to the fascia. Fascial incision was made and extended transversely using Mayo scissors. The fascia was separated from the underlying rectus tissue superiorly and inferiorly. The peritoneum was identified and entered. Peritoneal incision was extended longitudinally. The utero-vesical peritoneal reflection was incised transversely and the bladder flap was bluntly freed from the lower uterine segment. A low transverse uterine incision(Kerr hysterotomy) was made. Delivered from OA presentation was a  female with Apgar scores of pending at one minute and pending at five minutes. Bulb suctioning  gently performed. Neonatal team in attendance.After the umbilical cord was clamped and cut cord blood was obtained for evaluation. The placenta was removed intact and appeared normal. The uterus was curetted with a dry lap pack. Good hemostasis was noted.The uterine outline, tubes and ovaries appeared normal. The uterine incision was closed with running locked sutures of 0 Monocryl x 2 layers. Hemostasis was observed. Lavage was carried out until clear.The parietal peritoneum was closed with a running 2-0 Monocryl suture. The fascia was then reapproximated with running sutures of 0 Monocryl. The skin was reapproximated with 3-0 monocryl after Brimson closure with 2-0 plain.  Instrument, sponge, and needle counts were correct prior the abdominal closure and at the conclusion of the case.   Findings: Viable female to NICU. Anterior placenta  Estimated Blood Loss:  600         Drains: foley                 Specimens: placenta                 Complications:  None; patient tolerated the procedure well.         Disposition: PACU - hemodynamically stable.         Condition: stable  Attending Attestation: I performed the procedure.

## 2019-05-21 NOTE — Anesthesia Procedure Notes (Signed)
Spinal  Patient location during procedure: OR Start time: 05/21/2019 6:55 PM End time: 05/21/2019 6:59 PM Staffing Anesthesiologist: Suzette Battiest, MD Performed: anesthesiologist  Preanesthetic Checklist Completed: patient identified, site marked, surgical consent, pre-op evaluation, timeout performed, IV checked, risks and benefits discussed and monitors and equipment checked Spinal Block Patient position: sitting Prep: DuraPrep Patient monitoring: heart rate, cardiac monitor, continuous pulse ox and blood pressure Approach: midline Location: L4-5 Injection technique: single-shot Needle Needle type: Pencan  Needle gauge: 24 G Needle length: 9 cm Assessment Sensory level: T4

## 2019-05-21 NOTE — Anesthesia Postprocedure Evaluation (Signed)
Anesthesia Post Note  Patient: Sally Edwards  Procedure(s) Performed: REPEAT CESAREAN SECTION (N/A )     Patient location during evaluation: PACU Anesthesia Type: Spinal Level of consciousness: awake and alert Pain management: pain level controlled Vital Signs Assessment: post-procedure vital signs reviewed and stable Respiratory status: spontaneous breathing and respiratory function stable Cardiovascular status: blood pressure returned to baseline and stable Postop Assessment: spinal receding Anesthetic complications: no    Last Vitals:  Vitals:   05/21/19 2030 05/21/19 2040  BP: (!) 141/98 129/83  Pulse: 72 76  Resp: 15 18  Temp:    SpO2: 100% 99%    Last Pain:  Vitals:   05/21/19 2040  TempSrc:   PainSc: (P) 0-No pain   Pain Goal: Patients Stated Pain Goal: 6 (05/21/19 1436)  LLE Motor Response: No movement due to regional block (05/21/19 2015) LLE Sensation: Numbness (05/21/19 2015) RLE Motor Response: No movement due to regional block (05/21/19 2015) RLE Sensation: Numbness (05/21/19 2015)     Epidural/Spinal Function Cutaneous sensation: Tingles (05/21/19 2000), Patient able to flex knees: No (05/21/19 2000), Patient able to lift hips off bed: No (05/21/19 2000), Back pain beyond tenderness at insertion site: No (05/21/19 2000), Progressively worsening motor and/or sensory loss: No (05/21/19 2000), Bowel and/or bladder incontinence post epidural: No (05/21/19 2000)  Tiajuana Amass

## 2019-05-21 NOTE — Progress Notes (Signed)
Patient ID: Sally Edwards, female   DOB: [birth year], 33 y.o.   MRN: [id number] Epigastric  Pain today new onset Good FM. No new onset HAs BP (!) 134/91   Pulse 96   Temp 98 F (36.7 C) (Oral)   Resp 18   Ht 5\' 3"  (1.6 m)   Wt 90.3 kg   LMP 09/09/2018   SpO2 99%   BMI 35.25 kg/m   CBC    Component Value Date/Time   WBC 8.7 05/21/2019 1443   RBC 4.71 05/21/2019 1443   HGB 14.0 05/21/2019 1443   HCT 40.5 05/21/2019 1443   PLT 178 05/21/2019 1443   MCV 86.0 05/21/2019 1443   MCH 29.7 05/21/2019 1443   MCHC 34.6 05/21/2019 1443   RDW 13.2 05/21/2019 1443   LYMPHSABS 2.5 05/21/2019 1443   MONOABS 0.6 05/21/2019 1443   EOSABS 0.2 05/21/2019 1443   BASOSABS 0.0 05/21/2019 1443   CMP     Component Value Date/Time   NA 136 05/21/2019 1443   K 3.6 05/21/2019 1443   CL 104 05/21/2019 1443   CO2 21 (L) 05/21/2019 1443   GLUCOSE 70 05/21/2019 1443   BUN 5 (L) 05/21/2019 1443   CREATININE 0.38 (L) 05/21/2019 1443   CALCIUM 8.9 05/21/2019 1443   PROT 6.2 (L) 05/21/2019 1443   ALBUMIN 2.8 (L) 05/21/2019 1443   AST 61 (H) 05/21/2019 1443   ALT 74 (H) 05/21/2019 1443   ALKPHOS 135 (H) 05/21/2019 1443   BILITOT 0.7 05/21/2019 1443   GFRNONAA >60 05/21/2019 1443   GFRAA >60 05/21/2019 1443   IMP New onset epigastric pain with elevated LFTs Severe IUGR PEC Discussed with MFM Proceed with rpt csection and tL  Consent done.

## 2019-05-21 NOTE — Progress Notes (Signed)
States she has had severe upper abdominal pain with more on the RUQ since last night. States it kept her awake. No N/V, but a little diarrhea. States her BP was higher when taken during pain. States now the pain is a little less and intermittent. States she has frequent HA's, but none severe. States HA's are relieved by Tylenol, but come back when med has worn off. States she has intermittent blurred vision. States HA's and blurred vision are not new sxs. States she had labs in the office on Friday, 8/28 and was informed they were normal.

## 2019-05-22 LAB — COMPREHENSIVE METABOLIC PANEL
ALT: 51 U/L — ABNORMAL HIGH (ref 0–44)
AST: 34 U/L (ref 15–41)
Albumin: 2.4 g/dL — ABNORMAL LOW (ref 3.5–5.0)
Alkaline Phosphatase: 108 U/L (ref 38–126)
Anion gap: 10 (ref 5–15)
BUN: 7 mg/dL (ref 6–20)
CO2: 21 mmol/L — ABNORMAL LOW (ref 22–32)
Calcium: 7.2 mg/dL — ABNORMAL LOW (ref 8.9–10.3)
Chloride: 100 mmol/L (ref 98–111)
Creatinine, Ser: 0.57 mg/dL (ref 0.44–1.00)
GFR calc Af Amer: >60 mL/min (ref >60)
GFR calc non Af Amer: >60 mL/min (ref >60)
Glucose, Bld: 107 mg/dL — ABNORMAL HIGH (ref 70–99)
Potassium: 4 mmol/L (ref 3.5–5.1)
Sodium: 131 mmol/L — ABNORMAL LOW (ref 135–145)
Total Bilirubin: 0.4 mg/dL (ref 0.3–1.2)
Total Protein: 5.1 g/dL — ABNORMAL LOW (ref 6.5–8.1)

## 2019-05-22 LAB — CBC
HCT: 33.9 % — ABNORMAL LOW (ref 36.0–46.0)
Hemoglobin: 11.7 g/dL — ABNORMAL LOW (ref 12.0–15.0)
MCH: 29.9 pg (ref 26.0–34.0)
MCHC: 34.5 g/dL (ref 30.0–36.0)
MCV: 86.7 fL (ref 80.0–100.0)
Platelets: 198 10*3/uL (ref 150–400)
RBC: 3.91 MIL/uL (ref 3.87–5.11)
RDW: 13.2 % (ref 11.5–15.5)
WBC: 11.6 10*3/uL — ABNORMAL HIGH (ref 4.0–10.5)
nRBC: 0 % (ref 0.0–0.2)

## 2019-05-22 NOTE — Progress Notes (Signed)
Subjective: Postpartum Day 1: Cesarean Delivery Patient reports nausea, incisional pain, tolerating PO and + flatus.   No headaches , visual changes or epigastric pain Minimal bleeding. Incisional pain improved.  No further drainage  Objective: Vital signs in last 24 hours: Temp:  [97.7 F (36.5 C)-98.2 F (36.8 C)] 98 F (36.7 C) (09/02 0956) Pulse Rate:  [72-123] 81 (09/02 0956) Resp:  [15-26] 18 (09/02 0730) BP: (101-149)/(50-99) 101/63 (09/02 0956) SpO2:  [97 %-100 %] 98 % (09/02 0956) Weight:  [90.3 kg] 90.3 kg (09/01 1410)  Physical Exam:  General: alert, cooperative and appears stated age  Lungs: CTA CV: RRR Lochia: appropriate Uterine Fundus: firm, No RUQ tenderness Incision: no significant drainage, no significant erythema DVT Evaluation: Negative Homan's sign. No cords or calf tenderness.  Recent Labs    05/21/19 1443 05/22/19 0737  HGB 14.0 11.7*  HCT 40.5 33.9*   CBC    Component Value Date/Time   WBC 11.6 (H) 05/22/2019 0737   RBC 3.91 05/22/2019 0737   HGB 11.7 (L) 05/22/2019 0737   HCT 33.9 (L) 05/22/2019 0737   PLT 198 05/22/2019 0737   MCV 86.7 05/22/2019 0737   MCH 29.9 05/22/2019 0737   MCHC 34.5 05/22/2019 0737   RDW 13.2 05/22/2019 0737   LYMPHSABS 2.5 05/21/2019 1443   MONOABS 0.6 05/21/2019 1443   EOSABS 0.2 05/21/2019 1443   BASOSABS 0.0 05/21/2019 1443   CMP     Component Value Date/Time   NA 131 (L) 05/22/2019 0737   K 4.0 05/22/2019 0737   CL 100 05/22/2019 0737   CO2 21 (L) 05/22/2019 0737   GLUCOSE 107 (H) 05/22/2019 0737   BUN 7 05/22/2019 0737   CREATININE 0.57 05/22/2019 0737   CALCIUM 7.2 (L) 05/22/2019 0737   PROT 5.1 (L) 05/22/2019 0737   ALBUMIN 2.4 (L) 05/22/2019 0737   AST 34 05/22/2019 0737   ALT 51 (H) 05/22/2019 0737   ALKPHOS 108 05/22/2019 0737   BILITOT 0.4 05/22/2019 0737   GFRNONAA >60 05/22/2019 0737   GFRAA >60 05/22/2019 0737    Assessment/Plan: Status post Cesarean section. Doing well  postoperatively.  Severe PEC on Mag sulfate. Labs improved.  DC Mag at 24 hrs. Continue current care.  Alieyah Spader J 05/22/2019, 10:38 AM

## 2019-05-23 ENCOUNTER — Encounter (HOSPITAL_COMMUNITY): Payer: Self-pay | Admitting: Obstetrics and Gynecology

## 2019-05-23 NOTE — Progress Notes (Signed)
POSTOPERATIVE DAY # 2 S/P Repeat LTCS, severe pre-eclampsia, baby girl in NICU "Cleora Fleet"   S:         Reports feeling okay, better now off Magnesium  Denies headache and RUQ/epigastric pain; reports floaters  have been ongoing, but this morning seem a little more frequent since she's been back from NICU             Tolerating po intake / no nausea / no vomiting / + flatus / no BM  Denies dizziness, SOB, or CP             Bleeding is light             Pain controlled withMotrin and Percocet             Up ad lib / ambulatory/ voiding QS  Newborn doing well in NICU - mom is considering pumping colostrum; has been seen by lactation    O:  VS: BP 127/78 (BP Location: Right Arm)   Pulse 71   Temp 98.1 F (36.7 C) (Oral)   Resp 19   Ht 5\' 3"  (1.6 m)   Wt 90.3 kg   LMP 09/09/2018   SpO2 99%   BMI 35.25 kg/m  Date/Time  Temp  Pulse  ECG Heart Rate  Resp  BP  Patient Position (if appropriate)  SpO2  O2 Device  Weight Who   05/23/19 0750  98.1 F (36.7 C)  71  -  19  127/78  Semi-fowlers  99 %  -  - DG   05/23/19 0426  98.1 F (36.7 C)  84  -  16  135/85  Lying  99 %  -  - TP   05/23/19 0002  98.1 F (36.7 C)  94  -  20  129/83  Semi-fowlers  98 %  -  - TP   05/22/19 2136  -  94  -  -  134/83  -  -  -  - CS   05/22/19 2134  -  91  -  -  118/84  Semi-fowlers  98 %  -  - CS   05/22/19 1954  98.2 F (36.8 C)  87  -  18  133/81  Semi-fowlers  99 %  -  - CS   05/22/19 1515  97.8 F (36.6 C)  91  -  18  117/76  Semi-fowlers  98 %  -  - LS   05/22/19 1113  98.1 F (36.7 C)  81  -  18  111/66  Semi-fowlers  98 %  -  - LS   05/22/19 0956  98 F (36.7 C)  81  -  -  101/63  -  98 %  -  - CG   05/22/19 0730  97.7 F (36.5 C)  90  -  18  113/73  Semi-fowlers  100 %  -  - LS      LABS:               Recent Labs    05/21/19 1443 05/22/19 0737  WBC 8.7 11.6*  HGB 14.0 11.7*  PLT 178 198               Bloodtype: --/--/B POS (09/01 1444)  Rubella: Immune (02/26 0000)  I&O: Intake/Output      09/02 0701 - 09/03 0700 09/03 0701 - 09/04 0700   P.O. 3120    I.V. (mL/kg) 1689.4 (18.7)    Total Intake(mL/kg) 4809.4 (53.3)    Urine (mL/kg/hr) 2825 (1.3)    Blood     Total Output 2825    Net +1984.4         Urine Occurrence 2 x                 Physical Exam:             Alert and Oriented X3  Lungs: Clear and unlabored  Heart: regular rate and rhythm / no murmurs  Abdomen: soft, non-tender, non-distended, active bowel sounds in all quandrants             Fundus: firm, non-tender, U-2             Dressing: pressure dressing removed by me, saturated honeycomb dressing, but no active drainage              Incision:  approximated with sutures / no erythema / no ecchymosis / no drainage  Perineum: intact  Lochia: small rubra on pad   Extremities: trace LE edema, no calf pain or tenderness, +1 upper extremity edema; +2DTRs, no clonus  A/P:    POD # 2 S/P Repeat LTCS             Severe Pre-eclampsia    - BPs WNL since delivery, not currently on Labetalol    - Monitor neural s/s   Depression   - On Zoloft 50mg  daily; needs refills on d/c  ABL Anemia - stable, asymptomatic   Routine postoperative care              Encouraged increasing hydration   Continue current care  Change honeycomb dressing x 1  Anticipate d/c home tomorrow   Lars Pinks, MSN, CNM [facility]

## 2019-05-23 NOTE — Progress Notes (Signed)
Abdominal honeycomb dressing changed due to old drainage and per physician order.  Incision was approximated with no signs or symptoms of infection.  New honeycomb dressing applied.

## 2019-05-23 NOTE — Lactation Note (Signed)
This note was copied from a baby's chart. Lactation Consultation Note  Patient Name: Sally Edwards [id number] Date: 05/23/2019 Reason for consult: Late-preterm 34-36.6wks;Infant < 6lbs;Other (Comment);Initial assessment(per mom will call for LC if  needed -has the phone number)  Baby is 75 hours old  [facility] visited mom in her room 104.  Per mom breast feeding and latching did not go well with her 1st or 2nd baby and she does not plan to latch her baby. DEBP has been set up by the [facility] RN staff , and mom mentioned she is considering just pumping and bottle feeding so the baby gets the 1st milk - colostrum.  LC reassured mom the [facility] service is here to support her what ever she decides for her feeding preference.  LC reviewed sore nipple and engorgement prevention and tx.  Per mom has a DEBP at home.  LC recommended since she has a DEBP at home , she could leave her DEBP kit in NICU and plan to pump there in front of the baby.  LC provided the NICU booklet and the [facility] services pamphlet and reviewed storage of breast milk for NICU and when the baby is D/C in the baby and care booklet.      Maternal Data Does the patient have breastfeeding experience prior to this delivery?: Yes  Feeding Feeding Type: Donor Breast Milk Nipple Type: Slow - flow  LATCH Score                   Interventions Interventions: Breast feeding basics reviewed  Lactation Tools Discussed/Used Tools: Pump Breast pump type: Double-Electric Breast Pump WIC Program: No Pump Review: Milk Storage(per mom plans to pump this afternoon)   Consult Status Consult Status: PRN(per mom will call if needed) Follow-up type: In-patient    [place] 05/23/2019, 11:49 AM

## 2019-05-23 NOTE — Clinical Social Work Maternal (Signed)
CLINICAL SOCIAL WORK MATERNAL/CHILD NOTE  Patient Details  Name: LAYLAMARIE MEUSER MRN: [id number] Date of Birth: 06/10/1986  Date:  2018-10-09  Clinical Social Worker Initiating Note:  Laurey Arrow Date/Time: Initiated:  05/23/19/1152     Child's Name:  Cleora Fleet Parisien   Biological Parents:  Mother, Father   Need for Interpreter:  None   Reason for Referral:  Behavioral Health Concerns   Address:  [address] [place] [address]    Phone number:  [phone] (home)     Additional phone number:   Household Members/Support Persons (HM/SP):   Household Member/Support Person 1, Household Member/Support Person 2, Household Member/Support Person 3   HM/SP Name Relationship DOB or Age  HM/SP -1 Will Brucker FOB/Husband 08/03/1984  HM/SP -2 Shaquanna Lycan son 02/09/2015  HM/SP -3 Jahniah Pallas son 11/11/2013  HM/SP -4        HM/SP -5        HM/SP -6        HM/SP -7        HM/SP -8          Natural Supports (not living in the home):  Immediate Family, Extended Family, Friends   Chiropodist: None   Employment: Self-employed   Type of Work: Advertising account executive   Education:  Forensic psychologist   Homebound arranged:    Museum/gallery curator Resources:  Multimedia programmer   Other Resources:      Cultural/Religious Considerations Which May Impact Care:  None reported  Strengths:  Ability to meet basic needs , Engineer, materials, Home prepared for child , Understanding of illness, Psychotropic Medications   Psychotropic Medications:  Zoloft      Pediatrician:    Solicitor area  Lexicographer List:   [employer] [place]      Pediatrician Fax Number:    Risk Factors/Current Problems:      Cognitive State:  Alert , Able to Concentrate , Insightful , Linear Thinking    Mood/Affect:  Bright , Happy , Interested , Relaxed    CSW  Assessment: CSW met with MOB in room 104 to complete an assessment for NICU admission and hx of anxiety/depression.  When CSW arrived, MOB was resting and bed. MOB was polite, easy to engage, and receptive to meeting with CSW.  CSW explained CSW's role and encouraged MOB to ask questions.    CSW asked about MOB's thoughts and feelings and regarding infant's NICU admission. MOB stated feelings good and assured that infant's health is improving; which makes MOB feel better.  MOB reported having a good understanding of infant's health and reported feeling prepared to be a new mom again.  CSW asked about MOB's MH hx and MOB acknowledged a hx of anxiety and depression.  MOB stated, " I was dx years ago and medication helps manage my symptoms.  Per MOB, MOB is currently taking Zoloft (50m) and prior to pregnancy was taking Lexapro. MOB acknowledged PMADs symptoms with MOB's older 2 children and reports being aware of signs and symptoms. CSW recommended self-evaluation during the postpartum time period using the New Mom Checklist from Postpartum Progress and encouraged MOB to contact a medical professional if symptoms are noted at any time; MOB agreed  CSW assessed for safety and MOB denied SI, HI, and DV. MOB did not present with any acute symtoms and reported having  a good support team.   CSW Plan/Description:  Psychosocial Support and Ongoing Assessment of Needs, Sudden Infant Death Syndrome (SIDS) Education, Perinatal Mood and Anxiety Disorder (PMADs) Education, Other Information/Referral to Community Resources   Kealani Leckey Boyd-Gilyard, MSW, LCSW Clinical Social Work [phone]  Nyiah Pianka D BOYD-GILYARD, LCSW 05/23/2019, 1:48 PM 

## 2019-05-24 MED ORDER — NIFEDIPINE ER OSMOTIC RELEASE 30 MG PO TB24
30.0000 mg | ORAL_TABLET | Freq: Every day | ORAL | Status: DC
  Administered 2019-05-24 – 2019-05-25 (×2): 30 mg via ORAL
  Filled 2019-05-24 (×2): qty 1

## 2019-05-24 NOTE — Lactation Note (Signed)
This note was copied from a baby's chart. Lactation Consultation Note  Patient Name: Sally Edwards [id number] Date: 05/24/2019   Visited with mom of a 34 hours old NICU LPI female < 4 lbs, but mom is no longer pumping, she just wanted to offer baby her colostrum, and she told LC she wasn't going to do it (pump). Lactation services no longer needed, LC counseled to mom about engorgement prevention and treatment before exiting the room.  Maternal Data    Feeding Feeding Type: Donor Breast Milk Nipple Type: Dr. Myra Gianotti [facility]  [facility] Score                   Interventions    Lactation Tools Discussed/Used     Consult Status      [name] [name] 05/24/2019, 12:18 PM

## 2019-05-24 NOTE — Progress Notes (Signed)
Patient ID: Sally Edwards, female   DOB: [birth year], 33 y.o.   MRN: [id number] Subjective: POD# 3 S/P Repeat LTCS, severe pre-eclampsia Live born female  Birth Weight: 3 lb 15.1 oz (1790 g) APGAR: 5, 9 NICU admit for prematurity  Newborn Delivery   Birth date/time: 05/21/2019 19:19:00 Delivery type: C-Section, Low Transverse Trial of labor: No C-section categorization: Repeat     Baby name: Dorcas Carrow Delivering provider: Brien Few   Feeding: breast / pumping  Pain control at delivery: Spinal   Reports feeling well, still having some visual changes / blurry  Patient reports tolerating PO.   Breast symptoms:no colostrum seen Pain controlled with PO meds Denies HA/SOB/C/P/N/V/dizziness. Flatus present. She reports vaginal bleeding as normal, without clots.  She is ambulating, urinating without difficulty.     Objective:   VS:    Vitals:   05/23/19 1556 05/23/19 2015 05/23/19 2305 05/24/19 0734  BP: 129/87 128/77 (!) 131/92 133/86  Pulse: 72 72 68 77  Resp: 16 18 18 18   Temp: 98.1 F (36.7 C) 98.3 F (36.8 C) 98.4 F (36.9 C) 98.4 F (36.9 C)  TempSrc: Oral Oral Oral Oral  SpO2: 98% 98% 100% 100%  Weight:      Height:          Intake/Output Summary (Last 24 hours) at 05/24/2019 1047 Last data filed at 05/23/2019 1757 Gross per 24 hour  Intake 2750 ml  Output 1275 ml  Net 1475 ml     Filed Weights   05/21/19 1410  Weight: 90.3 kg     Recent Labs    05/21/19 1443 05/22/19 0737  WBC 8.7 11.6*  HGB 14.0 11.7*  HCT 40.5 33.9*  PLT 178 198     Blood type: --/--/B POS (09/01 1444)  Rubella: Immune (02/26 0000)     Physical Exam:  General: alert, cooperative and no distress Abdomen: soft, nontender, normal bowel sounds Incision: clean, dry and intact Uterine Fundus: firm, below umbilicus, nontender Lochia: minimal Ext: no edema, redness or tenderness in the calves or thighs      Assessment/Plan: 33 y.o.   POD# 3. [id number]      Principal Problem:   Postpartum care following cesarean delivery (9/1) Active Problems:   Preeclampsia   Cesarean delivery    Preeclampsia, severe  BP elevated overnight, continues with some neuro sx / visual changes Recc start Procardia 30 XL daily Continue strict I&O, daily weights Plan for DC in AM with close F/U in office after holiday weekend. Patient agreeable.  POC in consult w/ Dr. Madelaine Bhat, CNM, MSN 05/24/2019, 10:47 AM

## 2019-05-25 LAB — BPAM RBC
Blood Product Expiration Date: 202009272359
Blood Product Expiration Date: 202009272359
Unit Type and Rh: 7300
Unit Type and Rh: 7300

## 2019-05-25 LAB — TYPE AND SCREEN
ABO/RH(D): B POS
Antibody Screen: NEGATIVE
Unit division: 0
Unit division: 0

## 2019-05-25 MED ORDER — NIFEDIPINE ER 30 MG PO TB24: 30.0000 mg | ORAL_TABLET | Freq: Every day | ORAL | 1 refills | Status: AC

## 2019-05-25 MED ORDER — SENNOSIDES-DOCUSATE SODIUM 8.6-50 MG PO TABS: 2.0000 | ORAL_TABLET | ORAL | Status: AC

## 2019-05-25 MED ORDER — OXYCODONE-ACETAMINOPHEN 5-325 MG PO TABS: 1.0000 | ORAL_TABLET | ORAL | 0 refills | Status: AC | PRN

## 2019-05-25 MED ORDER — IBUPROFEN 800 MG PO TABS: 800.0000 mg | ORAL_TABLET | Freq: Four times a day (QID) | ORAL | 0 refills | Status: AC

## 2019-05-25 MED ORDER — SIMETHICONE 80 MG PO CHEW: 80.0000 mg | CHEWABLE_TABLET | ORAL | 0 refills | Status: AC | PRN

## 2019-05-25 NOTE — Discharge Summary (Signed)
OB Discharge Summary  Patient Name: Sally Edwards DOB: [birth year] MRN: [id number]  Date of admission: 05/21/2019 Delivering provider: Brien Few   Date of discharge: 05/25/2019  Admitting diagnosis: Rule out Pre E Intrauterine pregnancy: [redacted]w[redacted]d     Secondary diagnosis:Principal Problem:   Postpartum care following cesarean delivery (9/1) Active Problems:   Preeclampsia   Cesarean delivery    Preeclampsia, severe  Additional problems:none     Discharge diagnosis:  Patient Active Problem List   Diagnosis Date Noted  . Preeclampsia 05/21/2019  . Cesarean delivery  05/21/2019  . Postpartum care following cesarean delivery (9/1) 05/21/2019  . Preeclampsia, severe 05/21/2019  . Gestational hypertension, third trimester 04/09/2019                                                                Post partum procedures:Magnesium Sulfate x 24 hrs PostPartum   Pain control: Spinal   Complications: None   Hospital course:  Sceduled C/S   33 y.o. yo G3P2002 at [redacted]w[redacted]d was admitted to the hospital 05/21/2019 for gest htn, severe iugr and now new onset epigastric pain. She underwent a cesarean section for Prior Uterine Surgery.  Membrane Rupture Time/Date: 7:18 PM ,05/21/2019   Patient delivered a Viable infant.05/21/2019  Details of operation can be found in separate operative note.  Patient postpartum course significant for severe preeclampsia with elevated LFT's and urine PCR, blood pressure in mild range, with visual changes scotomata and blurry vision which resolved by postpartum day 4. She received magnesium sulfate intravenous x 24 hours with good diuresis, continued labetalol 100 mg PO BID and started on Procardia 30XL on postpartum day 3 after blood pressures started to trend into high normal, mild elevated range. LFT's improved postpartum and neural symptoms resolved. She is ambulating, tolerating a regular diet, passing flatus, and urinating well. Patient is discharged home in stable  condition on  05/25/19 with close follow up in office in 3 days. Preeclamptic precautions reinforced.          Physical exam  Vitals:   05/24/19 1959 05/25/19 0421 05/25/19 0500 05/25/19 1035  BP: (!) 134/91 (!) 139/96  (!) 137/91  Pulse: 100 88  99  Resp: 16 17  18   Temp: 98.2 F (36.8 C) 98 F (36.7 C)  97.7 F (36.5 C)  TempSrc: Oral Oral  Oral  SpO2: 100% 97%  97%  Weight:   87.5 kg   Height:       General: alert, cooperative and no distress Lochia: appropriate Uterine Fundus: firm Incision: Dressing is clean, dry, and intact DVT Evaluation: No cords or calf tenderness. Calf/Ankle edema is present + 1 pedal  Labs: Lab Results  Component Value Date   WBC 11.6 (H) 05/22/2019   HGB 11.7 (L) 05/22/2019   HCT 33.9 (L) 05/22/2019   MCV 86.7 05/22/2019   PLT 198 05/22/2019   CMP Latest Ref Rng & Units 05/22/2019  Glucose 70 - 99 mg/dL 107(H)  BUN 6 - 20 mg/dL 7  Creatinine 0.44 - 1.00 mg/dL 0.57  Sodium 135 - 145 mmol/L 131(L)  Potassium 3.5 - 5.1 mmol/L 4.0  Chloride 98 - 111 mmol/L 100  CO2 22 - 32 mmol/L 21(L)  Calcium 8.9 - 10.3 mg/dL 7.2(L)  Total Protein 6.5 - 8.1  g/dL 5.1(L)  Total Bilirubin 0.3 - 1.2 mg/dL 0.4  Alkaline Phos 38 - 126 U/L 108  AST 15 - 41 U/L 34  ALT 0 - 44 U/L 51(H)    Discharge instruction: per After Visit Summary and "Baby and Me Booklet".  After Visit Meds:  Allergies as of 05/25/2019   No Known Allergies     Medication List    TAKE these medications   calcium carbonate 500 MG chewable tablet Commonly known as: TUMS - dosed in mg elemental calcium Chew 2 tablets by mouth 4 (four) times daily as needed for indigestion or heartburn.   ibuprofen 800 MG tablet Commonly known as: ADVIL Take 1 tablet (800 mg total) by mouth every 6 (six) hours.   labetalol 100 MG tablet Commonly known as: NORMODYNE Take 1 tablet (100 mg total) by mouth 3 (three) times daily.   NIFEdipine 30 MG 24 hr tablet Commonly known as: ADALAT CC Take 1  tablet (30 mg total) by mouth daily. Start taking on: May 26, 2019   oxyCODONE-acetaminophen 5-325 MG tablet Commonly known as: PERCOCET/ROXICET Take 1-2 tablets by mouth every 4 (four) hours as needed for moderate pain.   PRENATAL VITAMIN PO Take 1 tablet by mouth daily.   senna-docusate 8.6-50 MG tablet Commonly known as: Senokot-S Take 2 tablets by mouth daily. Start taking on: May 26, 2019   sertraline 50 MG tablet Commonly known as: ZOLOFT Take 1 tablet (50 mg total) by mouth daily.   simethicone 80 MG chewable tablet Commonly known as: MYLICON Chew 1 tablet (80 mg total) by mouth as needed for flatulence.       Diet: routine diet  Activity: Advance as tolerated. Pelvic rest for 6 weeks.   Postpartum contraception: to be addressed at postpartum visit  Newborn Data: Live born female  Birth Weight: 3 lb 15.1 oz (1790 g) APGAR: 5, 9  Newborn Delivery   Birth date/time: 05/21/2019 19:19:00 Delivery type: C-Section, Low Transverse Trial of labor: No C-section categorization: Repeat      named Dorcas Carrow Baby Feeding: Bottle Disposition:NICU   Delivery Report:  Review the Delivery Report for details.    Follow up: Follow-up Information    Brien Few, MD. Schedule an appointment as soon as possible for a visit on 05/28/2019.   Specialty: Obstetrics and Gynecology Why: BP check Contact information: [place] [address] [phone]             Signed: Juliene Pina, CNM, MSN 05/25/2019, 10:45 AM

## 2019-05-28 ENCOUNTER — Other Ambulatory Visit (HOSPITAL_COMMUNITY)
Admission: RE | Admit: 2019-05-28 | Discharge: 2019-05-28 | Disposition: A | Discharge status: Home / Self Care | Payer: No Typology Code available for payment source | Source: Ambulatory Visit

## 2019-05-30 ENCOUNTER — Encounter (HOSPITAL_COMMUNITY): Admission: RE | Payer: Self-pay | Source: Home / Self Care

## 2019-05-30 ENCOUNTER — Inpatient Hospital Stay (HOSPITAL_COMMUNITY)
Admission: RE | Admit: 2019-05-30 | Payer: No Typology Code available for payment source | Source: Home / Self Care | Admitting: Obstetrics and Gynecology

## 2019-05-30 SURGERY — Surgical Case
Anesthesia: Spinal

## 2019-06-04 DIAGNOSIS — O139 Gestational [pregnancy-induced] hypertension without significant proteinuria, unspecified trimester: Secondary | ICD-10-CM | POA: Diagnosis not present

## 2020-03-19 DIAGNOSIS — Z419 Encounter for procedure for purposes other than remedying health state, unspecified: Secondary | ICD-10-CM | POA: Diagnosis not present

## 2020-04-19 DIAGNOSIS — Z419 Encounter for procedure for purposes other than remedying health state, unspecified: Secondary | ICD-10-CM | POA: Diagnosis not present

## 2020-05-20 DIAGNOSIS — Z419 Encounter for procedure for purposes other than remedying health state, unspecified: Secondary | ICD-10-CM | POA: Diagnosis not present

## 2020-06-19 DIAGNOSIS — Z419 Encounter for procedure for purposes other than remedying health state, unspecified: Secondary | ICD-10-CM | POA: Diagnosis not present

## 2020-07-20 DIAGNOSIS — Z419 Encounter for procedure for purposes other than remedying health state, unspecified: Secondary | ICD-10-CM | POA: Diagnosis not present

## 2020-08-19 DIAGNOSIS — Z419 Encounter for procedure for purposes other than remedying health state, unspecified: Secondary | ICD-10-CM | POA: Diagnosis not present

## 2020-09-19 DIAGNOSIS — Z419 Encounter for procedure for purposes other than remedying health state, unspecified: Secondary | ICD-10-CM | POA: Diagnosis not present

## 2020-10-20 DIAGNOSIS — Z419 Encounter for procedure for purposes other than remedying health state, unspecified: Secondary | ICD-10-CM | POA: Diagnosis not present

## 2020-11-17 DIAGNOSIS — Z419 Encounter for procedure for purposes other than remedying health state, unspecified: Secondary | ICD-10-CM | POA: Diagnosis not present

## 2020-12-18 DIAGNOSIS — Z419 Encounter for procedure for purposes other than remedying health state, unspecified: Secondary | ICD-10-CM | POA: Diagnosis not present

## 2021-01-17 DIAGNOSIS — Z419 Encounter for procedure for purposes other than remedying health state, unspecified: Secondary | ICD-10-CM | POA: Diagnosis not present

## 2021-02-17 DIAGNOSIS — Z419 Encounter for procedure for purposes other than remedying health state, unspecified: Secondary | ICD-10-CM | POA: Diagnosis not present

## 2021-03-19 DIAGNOSIS — Z419 Encounter for procedure for purposes other than remedying health state, unspecified: Secondary | ICD-10-CM | POA: Diagnosis not present

## 2021-04-19 DIAGNOSIS — Z419 Encounter for procedure for purposes other than remedying health state, unspecified: Secondary | ICD-10-CM | POA: Diagnosis not present

## 2021-05-20 DIAGNOSIS — Z419 Encounter for procedure for purposes other than remedying health state, unspecified: Secondary | ICD-10-CM | POA: Diagnosis not present

## 2021-06-19 DIAGNOSIS — Z419 Encounter for procedure for purposes other than remedying health state, unspecified: Secondary | ICD-10-CM | POA: Diagnosis not present

## 2021-07-20 DIAGNOSIS — Z419 Encounter for procedure for purposes other than remedying health state, unspecified: Secondary | ICD-10-CM | POA: Diagnosis not present

## 2021-08-19 DIAGNOSIS — Z419 Encounter for procedure for purposes other than remedying health state, unspecified: Secondary | ICD-10-CM | POA: Diagnosis not present

## 2021-09-19 DIAGNOSIS — Z419 Encounter for procedure for purposes other than remedying health state, unspecified: Secondary | ICD-10-CM | POA: Diagnosis not present

## 2021-10-20 DIAGNOSIS — Z419 Encounter for procedure for purposes other than remedying health state, unspecified: Secondary | ICD-10-CM | POA: Diagnosis not present

## 2021-11-17 DIAGNOSIS — Z419 Encounter for procedure for purposes other than remedying health state, unspecified: Secondary | ICD-10-CM | POA: Diagnosis not present

## 2021-12-18 DIAGNOSIS — Z419 Encounter for procedure for purposes other than remedying health state, unspecified: Secondary | ICD-10-CM | POA: Diagnosis not present

## 2022-01-17 DIAGNOSIS — Z419 Encounter for procedure for purposes other than remedying health state, unspecified: Secondary | ICD-10-CM | POA: Diagnosis not present

## 2022-02-17 DIAGNOSIS — Z419 Encounter for procedure for purposes other than remedying health state, unspecified: Secondary | ICD-10-CM | POA: Diagnosis not present

## 2022-03-19 DIAGNOSIS — Z419 Encounter for procedure for purposes other than remedying health state, unspecified: Secondary | ICD-10-CM | POA: Diagnosis not present

## 2022-04-19 DIAGNOSIS — Z419 Encounter for procedure for purposes other than remedying health state, unspecified: Secondary | ICD-10-CM | POA: Diagnosis not present

## 2022-05-20 DIAGNOSIS — Z419 Encounter for procedure for purposes other than remedying health state, unspecified: Secondary | ICD-10-CM | POA: Diagnosis not present

## 2022-06-19 DIAGNOSIS — Z419 Encounter for procedure for purposes other than remedying health state, unspecified: Secondary | ICD-10-CM | POA: Diagnosis not present

## 2022-07-20 DIAGNOSIS — Z419 Encounter for procedure for purposes other than remedying health state, unspecified: Secondary | ICD-10-CM | POA: Diagnosis not present

## 2022-08-19 DIAGNOSIS — Z419 Encounter for procedure for purposes other than remedying health state, unspecified: Secondary | ICD-10-CM | POA: Diagnosis not present
